# Patient Record
Sex: Female | Born: 2000 | Race: Black or African American | Hispanic: No | Marital: Single | Smoking: Former smoker
Health system: Southern US, Community
[De-identification: ages and names within clinical notes are randomized; demographics above are authoritative.]

## PROBLEM LIST (undated history)

## (undated) ENCOUNTER — Inpatient Hospital Stay (HOSPITAL_COMMUNITY): Payer: Self-pay

## (undated) DIAGNOSIS — R569 Unspecified convulsions: Secondary | ICD-10-CM

## (undated) HISTORY — PX: EYE SURGERY: SHX253

---
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
## 2019-04-14 ENCOUNTER — Ambulatory Visit: Payer: Self-pay | Admitting: Family Medicine

## 2019-05-14 ENCOUNTER — Other Ambulatory Visit: Payer: Self-pay

## 2019-05-14 ENCOUNTER — Encounter (HOSPITAL_COMMUNITY): Payer: Self-pay

## 2019-05-14 ENCOUNTER — Emergency Department (HOSPITAL_COMMUNITY)
Admission: EM | Admit: 2019-05-14 | Discharge: 2019-05-14 | Disposition: A | Discharge status: Home / Self Care | Payer: Self-pay | Attending: Emergency Medicine | Admitting: Emergency Medicine

## 2019-05-14 DIAGNOSIS — L02411 Cutaneous abscess of right axilla: Secondary | ICD-10-CM | POA: Insufficient documentation

## 2019-05-14 DIAGNOSIS — L0291 Cutaneous abscess, unspecified: Secondary | ICD-10-CM

## 2019-05-14 DIAGNOSIS — Z7722 Contact with and (suspected) exposure to environmental tobacco smoke (acute) (chronic): Secondary | ICD-10-CM | POA: Insufficient documentation

## 2019-05-14 LAB — POC URINE PREG, ED: Preg Test, Ur: NEGATIVE

## 2019-05-14 MED ORDER — NAPROXEN 500 MG PO TABS: 500.0000 mg | ORAL_TABLET | Freq: Two times a day (BID) | ORAL | 0 refills | Status: DC

## 2019-05-14 MED ORDER — DOXYCYCLINE HYCLATE 100 MG PO CAPS: 100.0000 mg | ORAL_CAPSULE | Freq: Two times a day (BID) | ORAL | 0 refills | Status: DC

## 2019-05-14 NOTE — ED Provider Notes (Signed)
[facility] DEPT Provider Note   CSN: [id number] Arrival date & time: 05/14/19  1430     History Chief Complaint  Patient presents with  . Recurrent Skin Infections    Carla Bean is a 19 y.o. female with a history of seizures who presents to the ED with complaints of R axillary abscess x 4 days. Patient states she has an area of increasing swelling/pain to the R axilla, worse with palpation, no alleviating factors. Taking ibuprofen without relief. She believes this was triggered by her deoderent use. History of similar which has resolved with abx, has not required prior I&D. Denies fever, chills, nausea, or vomiting.   HPI     Past Medical History:  Diagnosis Date  . Seizures [facility])     Patient Active Problem List   Diagnosis Date Noted  . Screen for STD (sexually transmitted disease) 05/27/2018  . Gonorrhea 12/02/2015  . Chlamydia 12/02/2015  . Vaginal discharge 11/27/2015  . Substance abuse ([place]) 03/25/2012  . Contraception management 11/13/2011  . Dry skin dermatitis 10/27/2010  . Cough 06/03/2010  . SCABIES 12/12/2009  . UNSPECIFIED VISUAL DISTURBANCE 04/09/2009    History reviewed. No pertinent surgical history.   OB History   No obstetric history on file.     History reviewed. No pertinent family history.  Social History   Tobacco Use  . Smoking status: Passive Smoke Exposure - Never Smoker  . Smokeless tobacco: Never Used  . Tobacco comment: mom smokes around her  Substance Use Topics  . Alcohol use: Not on file  . Drug use: Not on file    Home Medications Prior to Admission medications   Not on File    Allergies    Pineapple  Review of Systems   Review of Systems  Constitutional: Negative for chills and fever.  Respiratory: Negative for shortness of breath.   Cardiovascular: Negative for chest pain.  Gastrointestinal: Negative for abdominal pain, nausea and vomiting.  Genitourinary: Negative for  dysuria and vaginal bleeding.  Skin: Positive for wound.    Physical Exam Updated Vital Signs BP 103/66 (BP Location: Right Arm)   Pulse 96   Temp 98 F (36.7 C) (Oral)   Resp 17   LMP 04/30/2019 (LMP Unknown)   SpO2 100%   Physical Exam Vitals and nursing note reviewed.  Constitutional:      General: She is not in acute distress.    Appearance: Normal appearance. She is well-developed. She is not ill-appearing or toxic-appearing.  HENT:     Head: Normocephalic and atraumatic.  Eyes:     General:        Right eye: No discharge.        Left eye: No discharge.     Conjunctiva/sclera: Conjunctivae normal.  Cardiovascular:     Rate and Rhythm: Normal rate and regular rhythm.     Pulses:          Radial pulses are 2+ on the right side and 2+ on the left side.  Pulmonary:     Effort: Pulmonary effort is normal. No respiratory distress.     Breath sounds: Normal breath sounds. No wheezing, rhonchi or rales.  Abdominal:     General: There is no distension.     Palpations: Abdomen is soft.     Tenderness: There is no abdominal tenderness.  Musculoskeletal:     Cervical back: Normal range of motion and neck supple.     Comments: Upper extremities: intact AROM, some pain  to axilla with R shoulder movement. No focal bony tenderness.   Skin:    General: Skin is warm and dry.     Capillary Refill: Capillary refill takes less than 2 seconds.     Findings: No rash.     Comments: R axilla: There is a 2.5 cm diameter area of swelling with mild overlying erythema, somewhat fluctuant to palpation with surrounding induration. No active drainage. No open wounds. Area is tender to palpation.   Neurological:     Mental Status: She is alert.     Comments: Alert. Clear speech. Sensation grossly intact to bilateral upper extremities. 5/5 symmetric grip strength. Ambulatory.   Psychiatric:        Mood and Affect: Mood normal.        Behavior: Behavior normal.     ED Results / Procedures /  Treatments   Labs (all labs ordered are listed, but only abnormal results are displayed) Labs Reviewed  POC URINE PREG, ED    EKG None  Radiology No results found.  Procedures Procedures (including critical care time)  Medications Ordered in ED Medications - No data to display  ED Course  I have reviewed the triage vital signs and the nursing notes.  Pertinent labs & imaging results that were available during my care of the patient were reviewed by me and considered in my medical decision making (see chart for details).    MDM Rules/Calculators/A&P                      Patient presents to the ED with what appears to be a right sided axillary abscess based on physical exam and bedside US. I recommended incision and drainage in the ED, discussed risks/benefits/alternatives, ultimately patient declined procedure, she states she has had similar symptoms which have resolved with antibiotics and would prefer to try this option. We discussed she may ultimately may need to return for I&D, but feel this is reasonable at this time.  Will provide prescription for doxycycline and naproxen, recommended warm compresses. Preg test checked as patient reported chance of pregnancy, negative. I discussed treatment plan, need for follow-up, and return precautions with the patient. Provided opportunity for questions, patient confirmed understanding and is in agreement with plan.   Final Clinical Impression(s) / ED Diagnoses Final diagnoses:  Abscess    Rx / DC Orders ED Discharge Orders         Ordered    doxycycline (VIBRAMYCIN) 100 MG capsule  2 times daily     05/14/19 1533    naproxen (NAPROSYN) 500 MG tablet  2 times daily     05/14/19 1533           Kenlie Seki, [place] [employer] R, PA-C 05/14/19 1537    Charlynne Pander, MD 05/18/19 [phone]

## 2019-05-14 NOTE — Discharge Instructions (Addendum)
You were seen in the emergency department today for an area of pain and swelling to your right underarm area.  We are concerned that there is an process in this location.  We discussed the option of incision and drainage-should you change your mind regarding this procedure you may return to the emergency department at anytime for reevaluation of this possible procedure.   We are sending you home with doxycycline, an antibiotic to treat the infection.  Please take this as prescribed. We are also sending you home with naproxen to help with pain.   - Naproxen is a nonsteroidal anti-inflammatory medication that will help with pain and swelling. Be sure to take this medication as prescribed with food, 1 pill every 12 hours,  It should be taken with food, as it can cause stomach upset, and more seriously, stomach bleeding. Do not take other nonsteroidal anti-inflammatory medications with this such as Advil, Motrin, Aleve, Mobic, Goodie Powder, or Motrin.    You make take Tylenol per over the counter dosing with these medications.   We have prescribed you new medication(s) today. Discuss the medications prescribed today with your pharmacist as they can have adverse effects and interactions with your other medicines including over the counter and prescribed medications. Seek medical evaluation if you start to experience new or abnormal symptoms after taking one of these medicines, seek care immediately if you start to experience difficulty breathing, feeling of your throat closing, facial swelling, or rash as these could be indications of a more serious allergic reaction  Please apply warm compresses to the area for 15 minutes 5 times per day.   Follow up with your primary care provider within 3 days for a recheck of this area. Return to the Er for new or worsening symptoms including but not limited to increased pain, increased swelling, increased redness, fever, chills, or any other concerns.  Your pregnancy  test was negative.

## 2019-05-14 NOTE — ED Triage Notes (Signed)
Pt presents with c/o boil under her right arm. Pt reports hx of same.

## 2019-05-17 ENCOUNTER — Other Ambulatory Visit: Payer: Self-pay

## 2019-05-17 ENCOUNTER — Ambulatory Visit: Payer: Self-pay | Admitting: *Deleted

## 2019-05-17 ENCOUNTER — Emergency Department (HOSPITAL_COMMUNITY)
Admission: EM | Admit: 2019-05-17 | Discharge: 2019-05-17 | Disposition: A | Discharge status: Home / Self Care | Payer: Self-pay | Attending: Emergency Medicine | Admitting: Emergency Medicine

## 2019-05-17 ENCOUNTER — Encounter (HOSPITAL_COMMUNITY): Payer: Self-pay

## 2019-05-17 DIAGNOSIS — Z79899 Other long term (current) drug therapy: Secondary | ICD-10-CM | POA: Insufficient documentation

## 2019-05-17 DIAGNOSIS — F17290 Nicotine dependence, other tobacco product, uncomplicated: Secondary | ICD-10-CM | POA: Insufficient documentation

## 2019-05-17 DIAGNOSIS — L02411 Cutaneous abscess of right axilla: Secondary | ICD-10-CM | POA: Insufficient documentation

## 2019-05-17 MED ORDER — HYDROCODONE-ACETAMINOPHEN 5-325 MG PO TABS
1.0000 | ORAL_TABLET | Freq: Once | ORAL | Status: AC
  Administered 2019-05-17: 1 via ORAL
  Filled 2019-05-17: qty 1

## 2019-05-17 MED ORDER — HYDROCODONE-ACETAMINOPHEN 5-325 MG PO TABS: 1.0000 | ORAL_TABLET | Freq: Four times a day (QID) | ORAL | 0 refills | Status: DC | PRN

## 2019-05-17 MED ORDER — BUPIVACAINE HCL (PF) 0.5 % IJ SOLN
10.0000 mL | Freq: Once | INTRAMUSCULAR | Status: AC
  Administered 2019-05-17: 10 mL
  Filled 2019-05-17: qty 30

## 2019-05-17 MED ORDER — IBUPROFEN 200 MG PO TABS
600.0000 mg | ORAL_TABLET | Freq: Once | ORAL | Status: AC
  Administered 2019-05-17: 15:00:00 600 mg via ORAL
  Filled 2019-05-17: qty 3

## 2019-05-17 MED ORDER — LIDOCAINE-EPINEPHRINE-TETRACAINE (LET) TOPICAL GEL
3.0000 mL | Freq: Once | TOPICAL | Status: AC
  Administered 2019-05-17: 14:00:00 3 mL via TOPICAL
  Filled 2019-05-17: qty 3

## 2019-05-17 NOTE — ED Triage Notes (Signed)
Patient c/o right axillary abscess x 1 week. 

## 2019-05-17 NOTE — ED Provider Notes (Signed)
[place] DEPT Provider Note   CSN: [id number] Arrival date & time: 05/17/19  1037     History Chief Complaint  Patient presents with  . Abscess    Carla Bean is a 19 y.o. female.  HPI      Carla Bean is a 19 y.o. female, with a history of seizures, presenting to the ED with abscess to the right axilla for the last several days. She was seen in the ED a few days ago and I&D was suggested, but patient declined.  A trial of antibiotics was initiated.  Patient voices compliance with these antibiotics. Despite the antibiotics, the area has continued to grow.  Denies fever/chills, nausea/vomiting, numbness, extremity weakness, shortness of breath, or any other complaints.  Past Medical History:  Diagnosis Date  . Seizures [facility])     Patient Active Problem List   Diagnosis Date Noted  . Screen for STD (sexually transmitted disease) 05/27/2018  . Gonorrhea 12/02/2015  . Chlamydia 12/02/2015  . Vaginal discharge 11/27/2015  . Substance abuse ([place]) 03/25/2012  . Contraception management 11/13/2011  . Dry skin dermatitis 10/27/2010  . Cough 06/03/2010  . SCABIES 12/12/2009  . UNSPECIFIED VISUAL DISTURBANCE 04/09/2009    Past Surgical History:  Procedure Laterality Date  . EYE SURGERY       OB History   No obstetric history on file.     Family History  Problem Relation Age of Onset  . Cancer Mother     Social History   Tobacco Use  . Smoking status: Current Every Day Smoker    Types: Cigars  . Smokeless tobacco: Never Used  . Tobacco comment: mom smokes around her  Substance Use Topics  . Alcohol use: Never  . Drug use: Never    Home Medications Prior to Admission medications   Medication Sig Start Date End Date Taking? Authorizing Provider  doxycycline (VIBRAMYCIN) 100 MG capsule Take 1 capsule (100 mg total) by mouth 2 (two) times daily. 05/14/19   Petrucelli, Glynda Jaeger, PA-C  HYDROcodone-acetaminophen  (NORCO/VICODIN) 5-325 MG tablet Take 1-2 tablets by mouth every 6 (six) hours as needed for severe pain. 05/17/19   Makye Radle C, PA-C  naproxen (NAPROSYN) 500 MG tablet Take 1 tablet (500 mg total) by mouth 2 (two) times daily. 05/14/19   Petrucelli, Glynda Jaeger, PA-C    Allergies    Pineapple  Review of Systems   Review of Systems  Constitutional: Negative for chills and fever.  Respiratory: Negative for shortness of breath.   Cardiovascular: Negative for chest pain.  Gastrointestinal: Negative for nausea and vomiting.  Skin:       Right axillary abscess  Neurological: Negative for weakness and numbness.    Physical Exam Updated Vital Signs BP 117/72 (BP Location: Right Arm)   Pulse 88   Temp 98.6 F (37 C) (Oral)   Resp 18   Ht 5\' 3"  (1.6 m)   Wt 54 kg   LMP 04/30/2019 (LMP Unknown)   SpO2 100%   BMI 21.08 kg/m   Physical Exam Vitals and nursing note reviewed.  Constitutional:      General: She is not in acute distress.    Appearance: She is well-developed. She is not diaphoretic.  HENT:     Head: Normocephalic and atraumatic.  Eyes:     Conjunctiva/sclera: Conjunctivae normal.  Cardiovascular:     Rate and Rhythm: Normal rate and regular rhythm.     Pulses:  Radial pulses are 2+ on the right side.  Pulmonary:     Effort: Pulmonary effort is normal.  Musculoskeletal:     Cervical back: Neck supple.     Comments: Right axillary abscess approximately 4 x 2 cm.  Quite tender.  No surrounding tenderness, erythema, or increased warmth.  Skin:    General: Skin is warm and dry.     Coloration: Skin is not pale.  Neurological:     Mental Status: She is alert.     Comments: Sensation light touch grossly intact in the right upper extremity. Grip strength equal bilaterally. Strength 5/5 in the upper extremities.  Psychiatric:        Behavior: Behavior normal.     ED Results / Procedures / Treatments   Labs (all labs ordered are listed, but only abnormal  results are displayed) Labs Reviewed - No data to display  EKG None  Radiology No results found.  Procedures .Marland KitchenIncision and Drainage  Date/Time: 05/17/2019 2:45 PM Performed by: Anselm Pancoast, PA-C Authorized by: Anselm Pancoast, PA-C   Consent:    Consent obtained:  Verbal   Consent given by:  Patient   Risks discussed:  Bleeding, pain, incomplete drainage, infection and damage to other organs Location:    Type:  Abscess   Size:  4 cm   Location:  Upper extremity   Upper extremity location: Right axilla. Pre-procedure details:    Skin preparation:  Betadine Anesthesia (see MAR for exact dosages):    Anesthesia method:  Topical application and local infiltration   Topical anesthetic:  LET   Local anesthetic:  Bupivacaine 0.5% w/o epi Procedure type:    Complexity:  Complex Procedure details:    Incision types:  Single straight   Incision depth:  Subcutaneous   Scalpel blade:  11   Wound management:  Probed and deloculated, irrigated with saline and extensive cleaning   Drainage:  Purulent and bloody   Drainage amount:  Copious   Wound treatment:  Wound left open Post-procedure details:    Patient tolerance of procedure:  Tolerated well, no immediate complications  Ultrasound ED Soft Tissue  Date/Time: 05/17/2019 2:09 PM Performed by: Anselm Pancoast, PA-C Authorized by: Anselm Pancoast, PA-C   Procedure details:    Indications: localization of abscess     Transverse view:  Visualized   Longitudinal view:  Visualized   Images: archived     Limitations:  Patient compliance Location:    Location: axilla     Side:  Right Findings:     abscess present   (including critical care time)  Medications Ordered in ED Medications  bupivacaine (MARCAINE) 0.5 % injection 10 mL (10 mLs Infiltration Given 05/17/19 1513)  lidocaine-EPINEPHrine-tetracaine (LET) topical gel (3 mLs Topical Given 05/17/19 1346)  HYDROcodone-acetaminophen (NORCO/VICODIN) 5-325 MG per tablet 1 tablet (1  tablet Oral Given 05/17/19 1512)  ibuprofen (ADVIL) tablet 600 mg (600 mg Oral Given 05/17/19 1512)    ED Course  I have reviewed the triage vital signs and the nursing notes.  Pertinent labs & imaging results that were available during my care of the patient were reviewed by me and considered in my medical decision making (see chart for details).    MDM Rules/Calculators/A&P                      Patient presents with an abscess to the right axilla. Neurovascularly intact in the right upper extremity before and after I&D. Patient  advised to continue antibiotic therapy. Due to the size of the abscess, patient was advised to follow-up with general surgery should abscess start to reform. The patient was given instructions for home care as well as return precautions. Patient voices understanding of these instructions, accepts the plan, and is comfortable with discharge.    Final Clinical Impression(s) / ED Diagnoses Final diagnoses:  Abscess of axilla, right    Rx / DC Orders ED Discharge Orders         Ordered    HYDROcodone-acetaminophen (NORCO/VICODIN) 5-325 MG tablet  Every 6 hours PRN     05/17/19 1458           Anselm Pancoast, PA-C 05/17/19 1517    Derwood Kaplan, MD 05/18/19 [id number]

## 2019-05-17 NOTE — Discharge Instructions (Signed)
Wound Care - After I&D of Abscess  You may remove the bandage after 24 hours.  The only reason to replace the bandage is to protect clothing from drainage. Bandages, if used, should be replaced daily or whenever soiled. The wound may continue to drain for the next 2-3 days.   Cleaning: Clean the wound and surrounding area gently with tap water and mild soap. Rinse well and blot dry. You may shower normally. Soaking the wound in Epsom salt baths for no more than 15 minutes once a day may help rinse out any remaining pus and help with wound healing.  Clean the wound daily to prevent further infection. Do not use cleaners such as hydrogen peroxide or alcohol.   Scar reduction: Application of a topical antibiotic ointment, such as Neosporin, after the wound has begun to close and heal well can decrease scab formation and reduce scarring. After the wound has healed, application of ointments such as Aquaphor can also reduce scar formation.  The key to scar reduction is keeping the skin well hydrated and supple. Drinking plenty of water throughout the day (At least eight 8oz glasses of water a day) is essential to staying well hydrated.  Sun exposure: Keep the wound out of the sun. After the wound has healed, continue to protect it from the sun by wearing protective clothing or applying sunscreen.  Pain: You may use Tylenol, naproxen, or ibuprofen for pain. Antiinflammatory medications: Take 600 mg of ibuprofen every 6 hours or 440 mg (over the counter dose) to 500 mg (prescription dose) of naproxen every 12 hours for the next 3 days. After this time, these medications may be used as needed for pain. Take these medications with food to avoid upset stomach. Choose only one of these medications, do not take them together. Acetaminophen (generic for Tylenol): Should you continue to have additional pain while taking the ibuprofen or naproxen, you may add in acetaminophen as needed. Your daily total maximum amount  of acetaminophen from all sources should be limited to 4000mg/day for persons without liver problems, or 2000mg/day for those with liver problems.  Prevention: There are some people who have a predisposition to abscess formation, however, there are some things that can be done to prevent abscesses in many people.  Most abscesses form because bacteria that naturally lives on the skin gets trapped underneath the skin.  This can occur through openings too small to see. Before and after any area of skin is shaved, wax, or abraded in any manner, the area should be washed with soap and water and rinsed well.   If you are having trouble with recurrent abscesses, it may be wise to perform a chlorhexidine wash regimen.  For 1 week, wash all of your body with chlorhexidine (available over-the-counter at most pharmacies). You may also need to reevaluate your use of daily soap as soaps with perfumes or dyes can increase the chances of infection in some people.  Follow up: Please return to the ED or go to your primary care provider in 2-3 days for a wound check to assure proper healing.  Return: Return to the ED sooner should signs of worsening infection arise, such as spreading redness, worsening puffiness/swelling, severe increase in pain, fever over 100.3F, or any other major issues.  For prescription assistance, may try using prescription discount sites or apps, such as goodrx.com 

## 2019-05-17 NOTE — Telephone Encounter (Signed)
Patient states she was at hospital today and after she left within 2 hours started seeing bumps of face  Reason for Disposition . [1] Widespread hives AND [2] onset < 2 hours of exposure to 1st dose of drug    Rash on face started 2 hours after antibiotic- has spread on L side of face- started with itching- not itching now- no color at this time. 5 bumps present- face and under chin. Advised ED/UC for evaluation of possible medication reaction.  Answer Assessment - Initial Assessment Questions 1. APPEARANCE of RASH: "Describe the rash."      Acne type bumps 2. LOCATION: "Where is the rash located?"      L side of face 3. NUMBER: "How many spots are there?"      5  4. SIZE: "How big are the spots?" (Inches, centimeters or compare to size of a coin)      Mosquito bite, side of face- pencil eraser size 5. ONSET: "When did the rash start?"      1 hour ago 6. ITCHING: "Does the rash itch?" If so, ask: "How bad is the itch?"  (Scale 1-10; or mild, moderate, severe)     Started with itching- not itching now 7. PAIN: "Does the rash hurt?" If so, ask: "How bad is the pain?"  (Scale 1-10; or mild, moderate, severe)     No pain 8. OTHER SYMPTOMS: "Do you have any other symptoms?" (e.g., fever)     no 9. PREGNANCY: "Is there any chance you are pregnant?" "When was your last menstrual period?"     No- LMP- 3/28  Answer Assessment - Initial Assessment Questions 1.   NAME of MEDICATION: "What medicine are you calling about?"     Antibiotic given at ED 2.   QUESTION: "What is your question?"     Bumps appearing on L side of face within 2 hours of medication 3.   PRESCRIBING HCP: "Who prescribed it?" Reason: if prescribed by specialist, call should be referred to that group.     ED provider 4. SYMPTOMS: "Do you have any symptoms?"     Itching and bumps on face 5. SEVERITY: If symptoms are present, ask "Are they mild, moderate or severe?"     mild 6.  PREGNANCY:  "Is there any chance that you are  pregnant?" "When was your last menstrual period?"     n/a  Protocols used: RASH - WIDESPREAD ON DRUGS-A-AH, RASH OR REDNESS - LOCALIZED-A-AH, MEDICATION QUESTION CALL-A-AH

## 2019-05-31 ENCOUNTER — Ambulatory Visit (INDEPENDENT_AMBULATORY_CARE_PROVIDER_SITE_OTHER): Payer: Self-pay | Admitting: Family Medicine

## 2019-05-31 ENCOUNTER — Other Ambulatory Visit: Payer: Self-pay

## 2019-05-31 VITALS — BP 104/60 | HR 68 | Wt 116.0 lb

## 2019-05-31 DIAGNOSIS — Z3200 Encounter for pregnancy test, result unknown: Secondary | ICD-10-CM

## 2019-05-31 DIAGNOSIS — Z3202 Encounter for pregnancy test, result negative: Secondary | ICD-10-CM

## 2019-05-31 LAB — POCT URINE PREGNANCY: Preg Test, Ur: NEGATIVE

## 2019-05-31 NOTE — Progress Notes (Signed)
   SUBJECTIVE:   CHIEF COMPLAINT / HPI:   Irregular bleeding: Patient reporting today for pregnancy test after "missed period" on 05/24/2019. Last period was 04/30/2019. Patient reports that she bleeding regularly each month. She reports initially that she does not desire to be pregnant and then states she does want to be pregnant. Patient states she is worried she cannot conceive after receiving depo while in prison and her last injection was >3 months ago. She is currently not using any contraception. She does not wish to be tested for STI's. She denies any other concerns other than wanting a pregnancy test here int he office. Patient reports she has been trying to pregnant for over a year now but is not consistent and has not tracked any of her periods. She also reports not taking any prenatal vitamins.   PERTINENT  PMH / PSH: n/a  OBJECTIVE:  BP 104/60   Pulse 68   Wt 116 lb (52.6 kg)   LMP 04/30/2019 (LMP Unknown)   BMI 20.55 kg/m   General: NAD, pleasant Respiratory:  normal work of breathing Psych: flat affect  ASSESSMENT/PLAN:   Encounter for pregnancy test LMP 3/28. Negative test here today. Patient initially stated she did not want to be pregnant and was happy with negative results. Counseled on different contraception options, then patient stated she did want to be pregnant and had been trying unsuccessfully for quite some time. Asked patient to discuss with partner (who was at visit) what they both want and encouraged her to track her periods and take ovulation tests as well as prenatal vitamins and healthy behaviors if she is going to try to become pregant.     [place] Everett Ehrler, DO PGY-3, [facility]

## 2019-05-31 NOTE — Patient Instructions (Signed)
Thank you for coming to see me today. It was a pleasure! Today we talked about:   You can go to bedsider.org in order to find out more information on different tools for contraception if you decide you would not like to be pregnant at this time.  There are a lot of good options out there.  If you think of any questions or have any concerns and do not hesitate to call our office or come back in for a visit.  If you are thinking about becoming pregnant then it is important that you start taking a prenatal vitamin every day in order to ensure that you are getting the correct amount of vitamins early in pregnancy.  Please follow-up as needed.  If you have any questions or concerns, please do not hesitate to call the office at [phone].  Take Care,   [place] Bates Collington, DO

## 2019-06-03 DIAGNOSIS — Z3200 Encounter for pregnancy test, result unknown: Secondary | ICD-10-CM | POA: Insufficient documentation

## 2019-06-03 NOTE — Assessment & Plan Note (Signed)
LMP 3/28. Negative test here today. Patient initially stated she did not want to be pregnant and was happy with negative results. Counseled on different contraception options, then patient stated she did want to be pregnant and had been trying unsuccessfully for quite some time. Asked patient to discuss with partner (who was at visit) what they both want and encouraged her to track her periods and take ovulation tests as well as prenatal vitamins and healthy behaviors if she is going to try to become pregant.

## 2019-08-31 ENCOUNTER — Encounter (HOSPITAL_COMMUNITY): Payer: Self-pay | Admitting: Emergency Medicine

## 2019-08-31 ENCOUNTER — Other Ambulatory Visit: Payer: Self-pay

## 2019-08-31 ENCOUNTER — Emergency Department (HOSPITAL_COMMUNITY): Payer: HRSA Program

## 2019-08-31 ENCOUNTER — Emergency Department (HOSPITAL_COMMUNITY)
Admission: EM | Admit: 2019-08-31 | Discharge: 2019-08-31 | Disposition: A | Discharge status: Home / Self Care | Payer: HRSA Program | Attending: Emergency Medicine | Admitting: Emergency Medicine

## 2019-08-31 DIAGNOSIS — B349 Viral infection, unspecified: Secondary | ICD-10-CM | POA: Diagnosis not present

## 2019-08-31 DIAGNOSIS — R509 Fever, unspecified: Secondary | ICD-10-CM | POA: Diagnosis present

## 2019-08-31 DIAGNOSIS — F17290 Nicotine dependence, other tobacco product, uncomplicated: Secondary | ICD-10-CM | POA: Diagnosis not present

## 2019-08-31 DIAGNOSIS — U071 COVID-19: Secondary | ICD-10-CM | POA: Diagnosis not present

## 2019-08-31 DIAGNOSIS — M791 Myalgia, unspecified site: Secondary | ICD-10-CM | POA: Insufficient documentation

## 2019-08-31 DIAGNOSIS — Z20822 Contact with and (suspected) exposure to covid-19: Secondary | ICD-10-CM

## 2019-08-31 LAB — CBC WITH DIFFERENTIAL/PLATELET
Abs Immature Granulocytes: 0.02 10*3/uL (ref 0.00–0.07)
Basophils Absolute: 0 10*3/uL (ref 0.0–0.1)
Basophils Relative: 1 %
Eosinophils Absolute: 0.2 10*3/uL (ref 0.0–0.5)
Eosinophils Relative: 3 %
HCT: 39.9 % (ref 36.0–46.0)
Hemoglobin: 12.5 g/dL (ref 12.0–15.0)
Immature Granulocytes: 0 %
Lymphocytes Relative: 12 %
Lymphs Abs: 0.7 10*3/uL (ref 0.7–4.0)
MCH: 29.2 pg (ref 26.0–34.0)
MCHC: 31.3 g/dL (ref 30.0–36.0)
MCV: 93.2 fL (ref 80.0–100.0)
Monocytes Absolute: 0.5 10*3/uL (ref 0.1–1.0)
Monocytes Relative: 9 %
Neutro Abs: 4.3 10*3/uL (ref 1.7–7.7)
Neutrophils Relative %: 75 %
Platelets: 158 10*3/uL (ref 150–400)
RBC: 4.28 MIL/uL (ref 3.87–5.11)
RDW: 12.5 % (ref 11.5–15.5)
WBC: 5.7 10*3/uL (ref 4.0–10.5)
nRBC: 0 % (ref 0.0–0.2)

## 2019-08-31 LAB — URINALYSIS, ROUTINE W REFLEX MICROSCOPIC
Bilirubin Urine: NEGATIVE
Glucose, UA: NEGATIVE mg/dL
Hgb urine dipstick: NEGATIVE
Ketones, ur: NEGATIVE mg/dL
Leukocytes,Ua: NEGATIVE
Nitrite: NEGATIVE
Protein, ur: NEGATIVE mg/dL
Specific Gravity, Urine: 1.018 (ref 1.005–1.030)
pH: 8 (ref 5.0–8.0)

## 2019-08-31 LAB — COMPREHENSIVE METABOLIC PANEL
ALT: 10 U/L (ref 0–44)
AST: 16 U/L (ref 15–41)
Albumin: 4.1 g/dL (ref 3.5–5.0)
Alkaline Phosphatase: 60 U/L (ref 38–126)
Anion gap: 9 (ref 5–15)
BUN: <5 mg/dL — ABNORMAL LOW (ref 6–20)
CO2: 22 mmol/L (ref 22–32)
Calcium: 9.2 mg/dL (ref 8.9–10.3)
Chloride: 102 mmol/L (ref 98–111)
Creatinine, Ser: 0.85 mg/dL (ref 0.44–1.00)
GFR calc Af Amer: >60 mL/min (ref >60)
GFR calc non Af Amer: >60 mL/min (ref >60)
Glucose, Bld: 102 mg/dL — ABNORMAL HIGH (ref 70–99)
Potassium: 3.8 mmol/L (ref 3.5–5.1)
Sodium: 133 mmol/L — ABNORMAL LOW (ref 135–145)
Total Bilirubin: 0.5 mg/dL (ref 0.3–1.2)
Total Protein: 7.9 g/dL (ref 6.5–8.1)

## 2019-08-31 LAB — I-STAT BETA HCG BLOOD, ED (MC, WL, AP ONLY): I-stat hCG, quantitative: <5 m[IU]/mL (ref <5)

## 2019-08-31 LAB — SARS CORONAVIRUS 2 BY RT PCR (DIASORIN): SARS Coronavirus 2: POSITIVE — AB

## 2019-08-31 MED ORDER — ACETAMINOPHEN 325 MG PO TABS
650.0000 mg | ORAL_TABLET | Freq: Once | ORAL | Status: AC
  Administered 2019-08-31: 650 mg via ORAL
  Filled 2019-08-31: qty 2

## 2019-08-31 NOTE — ED Provider Notes (Signed)
[facility] EMERGENCY DEPARTMENT Provider Note   CSN: [id number] Arrival date & time: 08/31/19  2001     History Chief Complaint  Patient presents with  . Fever    Toryn Dewalt is a 19 y.o. female hx of STD here presenting with chills and fever and body aches and cough.  Patient states that she has some fevers and chills since yesterday.  She also has some dry cough as well.  She also has diffuse myalgias as well.  Denies any urinary symptoms.  Denies any neck pain or stiffness.  Patient states that she did not get Covid vaccine previously but denies any known contacts with Covid.  The history is provided by the patient.       Past Medical History:  Diagnosis Date  . Seizures [facility])     Patient Active Problem List   Diagnosis Date Noted  . Encounter for pregnancy test 06/03/2019  . Screen for STD (sexually transmitted disease) 05/27/2018  . Gonorrhea 12/02/2015  . Chlamydia 12/02/2015  . Vaginal discharge 11/27/2015  . Substance abuse (HCC) 03/25/2012  . Contraception management 11/13/2011    Past Surgical History:  Procedure Laterality Date  . EYE SURGERY       OB History   No obstetric history on file.     Family History  Problem Relation Age of Onset  . Cancer Mother     Social History   Tobacco Use  . Smoking status: Current Every Day Smoker    Types: Cigars  . Smokeless tobacco: Never Used  . Tobacco comment: mom smokes around her  Vaping Use  . Vaping Use: Never used  Substance Use Topics  . Alcohol use: Never  . Drug use: Never    Home Medications Prior to Admission medications   Medication Sig Start Date End Date Taking? Authorizing Provider  doxycycline (VIBRAMYCIN) 100 MG capsule Take 1 capsule (100 mg total) by mouth 2 (two) times daily. 05/14/19   Petrucelli, Pleas Koch, PA-C  HYDROcodone-acetaminophen (NORCO/VICODIN) 5-325 MG tablet Take 1-2 tablets by mouth every 6 (six) hours as needed for severe pain. 05/17/19    Joy, Shawn C, PA-C  naproxen (NAPROSYN) 500 MG tablet Take 1 tablet (500 mg total) by mouth 2 (two) times daily. 05/14/19   Petrucelli, Pleas Koch, PA-C    Allergies    Pineapple  Review of Systems   Review of Systems  Constitutional: Positive for fever.  Respiratory: Positive for cough.   All other systems reviewed and are negative.   Physical Exam Updated Vital Signs BP 120/68 (BP Location: Right Arm)   Pulse 65   Temp 98.3 F (36.8 C) (Oral)   Resp 20   Ht 5\' 3"  (1.6 m)   Wt 55 kg   LMP 08/09/2019 (Approximate)   SpO2 97%   BMI 21.48 kg/m   Physical Exam Vitals and nursing note reviewed.  Constitutional:      Comments: Slightly uncomfortable but well appearing   HENT:     Head: Normocephalic.     Nose: Nose normal.     Mouth/Throat:     Mouth: Mucous membranes are moist.  Eyes:     Extraocular Movements: Extraocular movements intact.     Pupils: Pupils are equal, round, and reactive to light.  Cardiovascular:     Rate and Rhythm: Normal rate and regular rhythm.     Pulses: Normal pulses.     Heart sounds: Normal heart sounds.  Pulmonary:     Effort:  Pulmonary effort is normal.     Breath sounds: Normal breath sounds.  Abdominal:     General: Abdomen is flat.     Palpations: Abdomen is soft.  Musculoskeletal:        General: Normal range of motion.     Cervical back: Normal range of motion and neck supple.  Skin:    General: Skin is warm.     Capillary Refill: Capillary refill takes less than 2 seconds.  Neurological:     General: No focal deficit present.     Mental Status: She is oriented to person, place, and time.  Psychiatric:        Mood and Affect: Mood normal.        Behavior: Behavior normal.     ED Results / Procedures / Treatments   Labs (all labs ordered are listed, but only abnormal results are displayed) Labs Reviewed  COMPREHENSIVE METABOLIC PANEL - Abnormal; Notable for the following components:      Result Value   Sodium 133 (*)     Glucose, Bld 102 (*)    BUN <5 (*)    All other components within normal limits  URINALYSIS, ROUTINE W REFLEX MICROSCOPIC - Abnormal; Notable for the following components:   APPearance HAZY (*)    All other components within normal limits  SARS CORONAVIRUS 2 BY RT PCR (DIASORIN)  CBC WITH DIFFERENTIAL/PLATELET  I-STAT BETA HCG BLOOD, ED ([facility], [facility], [facility] ONLY)    EKG None  Radiology DG Chest 2 View  Result Date: 08/31/2019 CLINICAL DATA:  Cough and fever EXAM: CHEST - 2 VIEW COMPARISON:  06/11/2014 FINDINGS: The heart size and mediastinal contours are within normal limits. Both lungs are clear. The visualized skeletal structures are unremarkable. IMPRESSION: No active cardiopulmonary disease. Electronically Signed   By: Jasmine Pang M.D.   On: 08/31/2019 21:08    Procedures Procedures (including critical care time)  Medications Ordered in ED Medications  acetaminophen (TYLENOL) tablet 650 mg (650 mg Oral Given 08/31/19 2026)    ED Course  I have reviewed the triage vital signs and the nursing notes.  Pertinent labs & imaging results that were available during my care of the patient were reviewed by me and considered in my medical decision making (see chart for details).    MDM Rules/Calculators/A&P                          Dejae Bernet is a 19 y.o. female here with cough, fever, chills, myalgias. I think likely COVID vs viral syndrome. Will get cbc, cmp, UA, COVID, CXR.   10:29 PM Temp down to 98 from 103. No meningeal signs. WBC normal. UA nl. CXR clear. COVId pending. Told her to stay home until COVID test comes back. If she is COVID positive, she needs to quarantine for 10 days.   Corleen Otwell was evaluated in Emergency Department on 08/31/2019 for the symptoms described in the history of present illness. She was evaluated in the context of the global COVID-19 pandemic, which necessitated consideration that the patient might be at risk for infection with the  SARS-CoV-2 virus that causes COVID-19. Institutional protocols and algorithms that pertain to the evaluation of patients at risk for COVID-19 are in a state of rapid change based on information released by regulatory bodies including the CDC and federal and state organizations. These policies and algorithms were followed during the patient's care in the ED.   Final Clinical Impression(s) /  ED Diagnoses Final diagnoses:  Viral syndrome  COVID-19 virus test result unknown    Rx / DC Orders ED Discharge Orders    None       Charlynne Pander, MD 08/31/19 2230

## 2019-08-31 NOTE — Discharge Instructions (Signed)
You were tested for Covid today.  Please stay home until your results come back.  If you do have Covid, you need to stay home for 10 days.  Take Tylenol and Motrin for fever.  See your doctor for follow-up.  Stay hydrated.  Return to ER if you have persistent fever for a week, trouble breathing, abdominal pain, vomiting.     Person Under Monitoring Name: Carla Bean  Location: [address] [place] [address]   Infection Prevention Recommendations for Individuals Confirmed to have, or Being Evaluated for, 2019 Novel Coronavirus (COVID-19) Infection Who Receive Care at Home  Individuals who are confirmed to have, or are being evaluated for, COVID-19 should follow the prevention steps below until a healthcare provider or local or state health department says they can return to normal activities.  Stay home except to get medical care You should restrict activities outside your home, except for getting medical care. Do not go to work, school, or public areas, and do not use public transportation or taxis.  Call ahead before visiting your doctor Before your medical appointment, call the healthcare provider and tell them that you have, or are being evaluated for, COVID-19 infection. This will help the healthcare provider's office take steps to keep other people from getting infected. Ask your healthcare provider to call the local or state health department.  Monitor your symptoms Seek prompt medical attention if your illness is worsening (e.g., difficulty breathing). Before going to your medical appointment, call the healthcare provider and tell them that you have, or are being evaluated for, COVID-19 infection. Ask your healthcare provider to call the local or state health department.  Wear a facemask You should wear a facemask that covers your nose and mouth when you are in the same room with other people and when you visit a healthcare provider. People who  live with or visit you should also wear a facemask while they are in the same room with you.  Separate yourself from other people in your home As much as possible, you should stay in a different room from other people in your home. Also, you should use a separate bathroom, if available.  Avoid sharing household items You should not share dishes, drinking glasses, cups, eating utensils, towels, bedding, or other items with other people in your home. After using these items, you should wash them thoroughly with soap and water.  Cover your coughs and sneezes Cover your mouth and nose with a tissue when you cough or sneeze, or you can cough or sneeze into your sleeve. Throw used tissues in a lined trash can, and immediately wash your hands with soap and water for at least 20 seconds or use an alcohol-based hand rub.  Wash your [employer] your hands often and thoroughly with soap and water for at least 20 seconds. You can use an alcohol-based hand sanitizer if soap and water are not available and if your hands are not visibly dirty. Avoid touching your eyes, nose, and mouth with unwashed hands.   Prevention Steps for Caregivers and Household Members of Individuals Confirmed to have, or Being Evaluated for, COVID-19 Infection Being Cared for in the Home  If you live with, or provide care at home for, a person confirmed to have, or being evaluated for, COVID-19 infection please follow these guidelines to prevent infection:  Follow healthcare provider's instructions Make sure that you understand and can help the patient follow any healthcare provider instructions for all care.  Provide for the patient's basic needs You should help the patient with basic needs in the home and provide support for getting groceries, prescriptions, and other personal needs.  Monitor the patient's symptoms If they are getting sicker, call his or her medical provider and tell them that the patient has, or is  being evaluated for, COVID-19 infection. This will help the healthcare provider's office take steps to keep other people from getting infected. Ask the healthcare provider to call the local or state health department.  Limit the number of people who have contact with the patient If possible, have only one caregiver for the patient. Other household members should stay in another home or place of residence. If this is not possible, they should stay in another room, or be separated from the patient as much as possible. Use a separate bathroom, if available. Restrict visitors who do not have an essential need to be in the home.  Keep older adults, very young children, and other sick people away from the patient Keep older adults, very young children, and those who have compromised immune systems or chronic health conditions away from the patient. This includes people with chronic heart, lung, or kidney conditions, diabetes, and cancer.  Ensure good ventilation Make sure that shared spaces in the home have good air flow, such as from an air conditioner or an opened window, weather permitting.  Wash your hands often Wash your hands often and thoroughly with soap and water for at least 20 seconds. You can use an alcohol based hand sanitizer if soap and water are not available and if your hands are not visibly dirty. Avoid touching your eyes, nose, and mouth with unwashed hands. Use disposable paper towels to dry your hands. If not available, use dedicated cloth towels and replace them when they become wet.  Wear a facemask and gloves Wear a disposable facemask at all times in the room and gloves when you touch or have contact with the patient's blood, body fluids, and/or secretions or excretions, such as sweat, saliva, sputum, nasal mucus, vomit, urine, or feces.  Ensure the mask fits over your nose and mouth tightly, and do not touch it during use. Throw out disposable facemasks and gloves after  using them. Do not reuse. Wash your hands immediately after removing your facemask and gloves. If your personal clothing becomes contaminated, carefully remove clothing and launder. Wash your hands after handling contaminated clothing. Place all used disposable facemasks, gloves, and other waste in a lined container before disposing them with other household waste. Remove gloves and wash your hands immediately after handling these items.  Do not share dishes, glasses, or other household items with the patient Avoid sharing household items. You should not share dishes, drinking glasses, cups, eating utensils, towels, bedding, or other items with a patient who is confirmed to have, or being evaluated for, COVID-19 infection. After the person uses these items, you should wash them thoroughly with soap and water.  Wash laundry thoroughly Immediately remove and wash clothes or bedding that have blood, body fluids, and/or secretions or excretions, such as sweat, saliva, sputum, nasal mucus, vomit, urine, or feces, on them. Wear gloves when handling laundry from the patient. Read and follow directions on labels of laundry or clothing items and detergent. In general, wash and dry with the warmest temperatures recommended on the label.  Clean all areas the individual has used often Clean all touchable surfaces, such as counters, tabletops, doorknobs, bathroom fixtures, toilets, phones, keyboards, tablets,  and bedside tables, every day. Also, clean any surfaces that may have blood, body fluids, and/or secretions or excretions on them. Wear gloves when cleaning surfaces the patient has come in contact with. Use a diluted bleach solution (e.g., dilute bleach with 1 part bleach and 10 parts water) or a household disinfectant with a label that says EPA-registered for coronaviruses. To make a bleach solution at home, add 1 tablespoon of bleach to 1 quart (4 cups) of water. For a larger supply, add  cup of bleach  to 1 gallon (16 cups) of water. Read labels of cleaning products and follow recommendations provided on product labels. Labels contain instructions for safe and effective use of the cleaning product including precautions you should take when applying the product, such as wearing gloves or eye protection and making sure you have good ventilation during use of the product. Remove gloves and wash hands immediately after cleaning.  Monitor yourself for signs and symptoms of illness Caregivers and household members are considered close contacts, should monitor their health, and will be asked to limit movement outside of the home to the extent possible. Follow the monitoring steps for close contacts listed on the symptom monitoring form.   ? If you have additional questions, contact your local health department or call the epidemiologist on call at [phone] (available 24/7). ? This guidance is subject to change. For the most up-to-date guidance from [facility], please refer to their website: [email or web address][email or web address]

## 2019-08-31 NOTE — ED Triage Notes (Signed)
Patient reports fever with chills , mild headache and occasional dry cough onset yesterday , no dysuria , respirations unlabored .

## 2019-12-29 ENCOUNTER — Encounter: Payer: Self-pay | Admitting: Surgery

## 2019-12-29 ENCOUNTER — Emergency Department (HOSPITAL_COMMUNITY): Payer: Self-pay

## 2019-12-29 ENCOUNTER — Encounter (HOSPITAL_COMMUNITY): Payer: Self-pay

## 2019-12-29 ENCOUNTER — Emergency Department (HOSPITAL_COMMUNITY)
Admission: EM | Admit: 2019-12-29 | Discharge: 2019-12-29 | Disposition: A | Discharge status: Home / Self Care | Payer: Self-pay | Attending: Emergency Medicine | Admitting: Emergency Medicine

## 2019-12-29 ENCOUNTER — Other Ambulatory Visit: Payer: Self-pay

## 2019-12-29 DIAGNOSIS — B9689 Other specified bacterial agents as the cause of diseases classified elsewhere: Secondary | ICD-10-CM

## 2019-12-29 DIAGNOSIS — R1030 Lower abdominal pain, unspecified: Secondary | ICD-10-CM | POA: Insufficient documentation

## 2019-12-29 DIAGNOSIS — N760 Acute vaginitis: Secondary | ICD-10-CM | POA: Insufficient documentation

## 2019-12-29 DIAGNOSIS — F17290 Nicotine dependence, other tobacco product, uncomplicated: Secondary | ICD-10-CM | POA: Insufficient documentation

## 2019-12-29 DIAGNOSIS — K529 Noninfective gastroenteritis and colitis, unspecified: Secondary | ICD-10-CM | POA: Insufficient documentation

## 2019-12-29 LAB — URINALYSIS, ROUTINE W REFLEX MICROSCOPIC
Bacteria, UA: NONE SEEN
Bilirubin Urine: NEGATIVE
Glucose, UA: NEGATIVE mg/dL
Ketones, ur: NEGATIVE mg/dL
Leukocytes,Ua: NEGATIVE
Nitrite: NEGATIVE
Protein, ur: NEGATIVE mg/dL
Specific Gravity, Urine: 1.014 (ref 1.005–1.030)
pH: 7 (ref 5.0–8.0)

## 2019-12-29 LAB — I-STAT BETA HCG BLOOD, ED (MC, WL, AP ONLY): I-stat hCG, quantitative: <5 m[IU]/mL (ref <5)

## 2019-12-29 LAB — WET PREP, GENITAL
Sperm: NONE SEEN
Trich, Wet Prep: NONE SEEN
Yeast Wet Prep HPF POC: NONE SEEN

## 2019-12-29 LAB — LIPASE, BLOOD: Lipase: 35 U/L (ref 11–51)

## 2019-12-29 LAB — COMPREHENSIVE METABOLIC PANEL
ALT: 9 U/L (ref 0–44)
AST: 14 U/L — ABNORMAL LOW (ref 15–41)
Albumin: 4.6 g/dL (ref 3.5–5.0)
Alkaline Phosphatase: 58 U/L (ref 38–126)
Anion gap: 11 (ref 5–15)
BUN: 6 mg/dL (ref 6–20)
CO2: 22 mmol/L (ref 22–32)
Calcium: 9.1 mg/dL (ref 8.9–10.3)
Chloride: 101 mmol/L (ref 98–111)
Creatinine, Ser: 0.63 mg/dL (ref 0.44–1.00)
GFR, Estimated: >60 mL/min (ref >60)
Glucose, Bld: 111 mg/dL — ABNORMAL HIGH (ref 70–99)
Potassium: 3.5 mmol/L (ref 3.5–5.1)
Sodium: 134 mmol/L — ABNORMAL LOW (ref 135–145)
Total Bilirubin: 0.8 mg/dL (ref 0.3–1.2)
Total Protein: 8.2 g/dL — ABNORMAL HIGH (ref 6.5–8.1)

## 2019-12-29 LAB — CBC
HCT: 39.6 % (ref 36.0–46.0)
Hemoglobin: 12.8 g/dL (ref 12.0–15.0)
MCH: 30.7 pg (ref 26.0–34.0)
MCHC: 32.3 g/dL (ref 30.0–36.0)
MCV: 95 fL (ref 80.0–100.0)
Platelets: 174 10*3/uL (ref 150–400)
RBC: 4.17 MIL/uL (ref 3.87–5.11)
RDW: 12 % (ref 11.5–15.5)
WBC: 15.2 10*3/uL — ABNORMAL HIGH (ref 4.0–10.5)
nRBC: 0 % (ref 0.0–0.2)

## 2019-12-29 MED ORDER — IOHEXOL 300 MG/ML  SOLN
100.0000 mL | Freq: Once | INTRAMUSCULAR | Status: AC | PRN
  Administered 2019-12-29: 100 mL via INTRAVENOUS

## 2019-12-29 MED ORDER — METRONIDAZOLE 500 MG PO TABS: 500.0000 mg | ORAL_TABLET | Freq: Two times a day (BID) | ORAL | 0 refills | Status: AC

## 2019-12-29 NOTE — ED Provider Notes (Signed)
[facility] EMERGENCY DEPARTMENT Provider Note  CSN: [id number] Arrival date & time: 12/29/19 1321    History Chief Complaint  Patient presents with  . possible STD  . Abdominal Pain    HPI  Carla Bean is a 19 y.o. female with no significant PMH who reports onset of moderate to severe cramping lower abdominal pain yesterday, not similar to her previous menstrual cramps. LMP started 2 days ago. She reports pain is worse when urinating but no dysuria or hematuria. Denies vaginal discharge but reports her BF recently checked/treated for STI but not a specific diagnosis yet. She denies any N/V/D. No URI symptoms. Not noted to be febrile at home prior to arrival.    Past Medical History:  Diagnosis Date  . Seizures (HCC)     Past Surgical History:  Procedure Laterality Date  . EYE SURGERY      Family History  Problem Relation Age of Onset  . Cancer Mother     Social History   Tobacco Use  . Smoking status: Current Every Day Smoker    Types: Cigars  . Smokeless tobacco: Never Used  . Tobacco comment: mom smokes around her  Vaping Use  . Vaping Use: Never used  Substance Use Topics  . Alcohol use: Never  . Drug use: Never     Home Medications Prior to Admission medications   Medication Sig Start Date End Date Taking? Authorizing Provider  metroNIDAZOLE (FLAGYL) 500 MG tablet Take 1 tablet (500 mg total) by mouth 2 (two) times daily for 7 days. 12/29/19 01/05/20  Pollyann Savoy, MD     Allergies    Pineapple   Review of Systems   Review of Systems A comprehensive review of systems was completed and negative except as noted in HPI.    Physical Exam BP 114/75   Pulse (!) 103   Temp 98.6 F (37 C)   Resp 19   Ht 5\' 3"  (1.6 m)   Wt 50.7 kg   LMP 12/29/2019   SpO2 98%   BMI 19.80 kg/m   Physical Exam Vitals and nursing note reviewed.  Constitutional:      Appearance: Normal appearance.  HENT:     Head: Normocephalic and atraumatic.      Nose: Nose normal.     Mouth/Throat:     Mouth: Mucous membranes are moist.  Eyes:     Extraocular Movements: Extraocular movements intact.     Conjunctiva/sclera: Conjunctivae normal.  Cardiovascular:     Rate and Rhythm: Normal rate.  Pulmonary:     Effort: Pulmonary effort is normal.     Breath sounds: Normal breath sounds.  Abdominal:     General: Abdomen is flat.     Palpations: Abdomen is soft.     Tenderness: There is abdominal tenderness in the right lower quadrant, suprapubic area and left lower quadrant. There is guarding. Positive signs include McBurney's sign. Negative signs include Murphy's sign.  Musculoskeletal:        General: No swelling. Normal range of motion.     Cervical back: Neck supple.  Skin:    General: Skin is warm and dry.  Neurological:     General: No focal deficit present.     Mental Status: She is alert.  Psychiatric:        Mood and Affect: Mood normal.      ED Results / Procedures / Treatments   Labs (all labs ordered are listed, but only abnormal results are displayed)  Labs Reviewed  WET PREP, GENITAL - Abnormal; Notable for the following components:      Result Value   Clue Cells Wet Prep HPF POC PRESENT (*)    WBC, Wet Prep HPF POC MODERATE (*)    All other components within normal limits  COMPREHENSIVE METABOLIC PANEL - Abnormal; Notable for the following components:   Sodium 134 (*)    Glucose, Bld 111 (*)    Total Protein 8.2 (*)    AST 14 (*)    All other components within normal limits  CBC - Abnormal; Notable for the following components:   WBC 15.2 (*)    All other components within normal limits  URINALYSIS, ROUTINE W REFLEX MICROSCOPIC - Abnormal; Notable for the following components:   Hgb urine dipstick MODERATE (*)    All other components within normal limits  LIPASE, BLOOD  RPR  I-STAT BETA HCG BLOOD, ED ([facility], [facility], [facility] ONLY)  GC/CHLAMYDIA PROBE AMP ([place]) NOT AT [facility]    EKG None  Radiology CT Abdomen  Pelvis W Contrast  Result Date: 12/29/2019 CLINICAL DATA:  19 year old female with right lower quadrant abdominal pain. EXAM: CT ABDOMEN AND PELVIS WITH CONTRAST TECHNIQUE: Multidetector CT imaging of the abdomen and pelvis was performed using the standard protocol following bolus administration of intravenous contrast. CONTRAST:  OMNIPAQUE IOHEXOL 300 MG/ML  SOLN COMPARISON:  None. FINDINGS: Lower chest: The visualized lung bases are clear. No intra-abdominal free air or free fluid. Hepatobiliary: No focal liver abnormality is seen. No gallstones, gallbladder wall thickening, or biliary dilatation. Pancreas: Unremarkable. No pancreatic ductal dilatation or surrounding inflammatory changes. Spleen: Normal in size without focal abnormality. Adrenals/Urinary Tract: The adrenal glands unremarkable. The kidneys, visualized ureters, and urinary bladder appear unremarkable. Stomach/Bowel: There is no bowel obstruction. Mild thickened appearance of the jejunal folds may represent mild enteritis. Clinical correlation is recommended. The appendix is poorly visualized. A somewhat tubular structure in the right lower quadrant posterior to the cecum (coronal 52/5 and axial 60/2) may represent a normal appendix. Vascular/Lymphatic: The abdominal aorta and IVC unremarkable. No portal venous gas. There is no adenopathy. Reproductive: The uterus is anteverted and grossly unremarkable. No adnexal masses. A tampon is noted. Other: None Musculoskeletal: No acute or significant osseous findings. IMPRESSION: 1. Suboptimal evaluation of the appendix. No definite evidence of acute appendicitis. 2. Mild thickened appearance of the jejunal folds may represent mild enteritis. Clinical correlation is recommended. No bowel obstruction. Electronically Signed   By: Elgie Collard M.D.   On: 12/29/2019 18:16    Procedures Procedures  Medications Ordered in the ED Medications  iohexol (OMNIPAQUE) 300 MG/ML solution 100 mL (100  mLs Intravenous Contrast Given 12/29/19 1741)     MDM Rules/Calculators/A&P MDM Patient here with lower abdominal pain/tenderness and fever. Labs done in triage show elevated WBC, normal UA and CMP. Will check pelvic exam to collect swabs and to check for signs of PID.  ED Course  I have reviewed the triage vital signs and the nursing notes.  Pertinent labs & imaging results that were available during my care of the patient were reviewed by me and considered in my medical decision making (see chart for details).  Clinical Course as of Dec 28 2229  Fri Dec 29, 2019  1618 Pelvic: chaperone present, mild old blood consistent with menses, no discharge, no CMT or adnexal tenderness but there is tenderness over Mcburney's point. Will send for CT.    [CS]  1713 Wet prep with clue  cells and WBC. Will need treatment for BV at discharge.    [CS]  1827 CT images and results reviewed. Appendix not well visualized, non-diagnostic for appendicitis. Pain is improved now and no longer guarding in RLQ, will discuss with Gen Surgery.    [CS]  1843 Spoke with Dr Michaell Cowing, discussed exam as well as lab/imaging findings. Given her improvement while in the ED and no further tenderness he does not feel OR or admission for serial exams is necessary so long as she can tolerate PO without significant worsening in pain or vomiting.   [CS]  1941 Patient able to eat crackers without pain. She is comfortable going home. Will give Rx for Flagyl for BV, strict return precautions for worsening pain. GC/CT pending.    [CS]    Clinical Course User Index [CS] Pollyann Savoy, MD    Final Clinical Impression(s) / ED Diagnoses Final diagnoses:  Lower abdominal pain  Bacterial vaginosis    Rx / DC Orders ED Discharge Orders         Ordered    metroNIDAZOLE (FLAGYL) 500 MG tablet  2 times daily        12/29/19 1943           Pollyann Savoy, MD 12/29/19 2231

## 2019-12-29 NOTE — ED Notes (Signed)
Sent 2 gold tops to lab

## 2019-12-29 NOTE — ED Triage Notes (Signed)
Patient c/o lower abdominal pain since yesterday. Patient states pain n the lower abdomen is worse when she urinates.

## 2019-12-30 LAB — RPR: RPR Ser Ql: NONREACTIVE

## 2020-01-01 ENCOUNTER — Encounter: Payer: Self-pay | Admitting: Family Medicine

## 2020-01-01 LAB — GC/CHLAMYDIA PROBE AMP (~~LOC~~) NOT AT ARMC
Chlamydia: NEGATIVE
Comment: NEGATIVE
Comment: NORMAL
Neisseria Gonorrhea: POSITIVE — AB

## 2020-01-02 ENCOUNTER — Encounter (HOSPITAL_COMMUNITY): Payer: Self-pay

## 2020-01-02 ENCOUNTER — Emergency Department (HOSPITAL_COMMUNITY)
Admission: EM | Admit: 2020-01-02 | Discharge: 2020-01-02 | Disposition: A | Discharge status: Home / Self Care | Payer: Self-pay | Attending: Emergency Medicine | Admitting: Emergency Medicine

## 2020-01-02 DIAGNOSIS — R1031 Right lower quadrant pain: Secondary | ICD-10-CM | POA: Insufficient documentation

## 2020-01-02 DIAGNOSIS — Z5321 Procedure and treatment not carried out due to patient leaving prior to being seen by health care provider: Secondary | ICD-10-CM | POA: Insufficient documentation

## 2020-01-02 LAB — CBC
HCT: 38.6 % (ref 36.0–46.0)
Hemoglobin: 12.5 g/dL (ref 12.0–15.0)
MCH: 30.6 pg (ref 26.0–34.0)
MCHC: 32.4 g/dL (ref 30.0–36.0)
MCV: 94.4 fL (ref 80.0–100.0)
Platelets: 211 10*3/uL (ref 150–400)
RBC: 4.09 MIL/uL (ref 3.87–5.11)
RDW: 11.9 % (ref 11.5–15.5)
WBC: 10.5 10*3/uL (ref 4.0–10.5)
nRBC: 0 % (ref 0.0–0.2)

## 2020-01-02 LAB — COMPREHENSIVE METABOLIC PANEL
ALT: 7 U/L (ref 0–44)
AST: 13 U/L — ABNORMAL LOW (ref 15–41)
Albumin: 4.5 g/dL (ref 3.5–5.0)
Alkaline Phosphatase: 48 U/L (ref 38–126)
Anion gap: 10 (ref 5–15)
BUN: 8 mg/dL (ref 6–20)
CO2: 25 mmol/L (ref 22–32)
Calcium: 9.3 mg/dL (ref 8.9–10.3)
Chloride: 99 mmol/L (ref 98–111)
Creatinine, Ser: 0.66 mg/dL (ref 0.44–1.00)
GFR, Estimated: >60 mL/min (ref >60)
Glucose, Bld: 93 mg/dL (ref 70–99)
Potassium: 3.2 mmol/L — ABNORMAL LOW (ref 3.5–5.1)
Sodium: 134 mmol/L — ABNORMAL LOW (ref 135–145)
Total Bilirubin: 0.8 mg/dL (ref 0.3–1.2)
Total Protein: 8.5 g/dL — ABNORMAL HIGH (ref 6.5–8.1)

## 2020-01-02 LAB — I-STAT BETA HCG BLOOD, ED (MC, WL, AP ONLY): I-stat hCG, quantitative: <5 m[IU]/mL (ref <5)

## 2020-01-02 LAB — LIPASE, BLOOD: Lipase: 34 U/L (ref 11–51)

## 2020-01-02 NOTE — ED Triage Notes (Signed)
Patient reports lower abdominal pain X2 days. Patient states she was seen here 2 days ago for same.   Pt states the pain moved from lower/mid abdominal pain to right lower. Pt was told to come back if pain came back to rule out appendicitis.   A/Ox4 Wheelchair in triage.

## 2020-01-03 ENCOUNTER — Other Ambulatory Visit: Payer: Self-pay

## 2020-01-03 ENCOUNTER — Ambulatory Visit (INDEPENDENT_AMBULATORY_CARE_PROVIDER_SITE_OTHER): Payer: Self-pay

## 2020-01-03 DIAGNOSIS — A549 Gonococcal infection, unspecified: Secondary | ICD-10-CM

## 2020-01-03 MED ORDER — CEFTRIAXONE SODIUM 500 MG IJ SOLR
500.0000 mg | Freq: Once | INTRAMUSCULAR | Status: AC
  Administered 2020-01-03: 500 mg via INTRAMUSCULAR

## 2020-01-03 NOTE — Progress Notes (Signed)
Patient in nurse clinic today for STD treatment of Gonorrhea.  Patient advised to abstain from sex for 7-10 days after treatment of self and partner.    Ceftriaxone 250 mg IM x 1 given in RUOQ Ceftriaxone 250 mg IM x 1 given in LUOQ per providers order. Patient observed 15 minutes in office. No reaction noted.   Patient to follow up in 2-3 months for re-screening.

## 2020-04-25 ENCOUNTER — Emergency Department (HOSPITAL_COMMUNITY)
Admission: EM | Admit: 2020-04-25 | Discharge: 2020-04-25 | Disposition: A | Discharge status: Home / Self Care | Payer: Self-pay | Attending: Emergency Medicine | Admitting: Emergency Medicine

## 2020-04-25 DIAGNOSIS — N3090 Cystitis, unspecified without hematuria: Secondary | ICD-10-CM | POA: Insufficient documentation

## 2020-04-25 DIAGNOSIS — F17290 Nicotine dependence, other tobacco product, uncomplicated: Secondary | ICD-10-CM | POA: Insufficient documentation

## 2020-04-25 LAB — URINALYSIS, ROUTINE W REFLEX MICROSCOPIC
Bilirubin Urine: NEGATIVE
Glucose, UA: NEGATIVE mg/dL
Hgb urine dipstick: NEGATIVE
Ketones, ur: 5 mg/dL — AB
Nitrite: NEGATIVE
Protein, ur: 30 mg/dL — AB
Specific Gravity, Urine: 1.029 (ref 1.005–1.030)
pH: 5 (ref 5.0–8.0)

## 2020-04-25 LAB — PREGNANCY, URINE: Preg Test, Ur: NEGATIVE

## 2020-04-25 MED ORDER — NITROFURANTOIN MONOHYD MACRO 100 MG PO CAPS: 100.0000 mg | ORAL_CAPSULE | Freq: Two times a day (BID) | ORAL | 0 refills | Status: DC

## 2020-04-25 NOTE — ED Triage Notes (Signed)
Pt here with c/o urinary frequency just like her other uti's no discharge or bleeding noted

## 2020-04-25 NOTE — Discharge Instructions (Signed)
  Hydration: Symptoms of most illnesses will be intensified and complicated by dehydration. Dehydration can also extend the duration of symptoms. Drink plenty of fluids and get plenty of rest. You should be drinking at least half a liter of water an hour to stay hydrated. Electrolyte drinks (ex. Gatorade, Powerade, Pedialyte) are also encouraged. You should be drinking enough fluids to make your urine light yellow, almost clear. If this is not the case, you are not drinking enough water. Please note that some of the treatments indicated below will not be effective if you are not adequately hydrated. Please take all of your antibiotics until finished!   You may develop abdominal discomfort or diarrhea from the antibiotic.  You may help offset this with probiotics which you can buy or get in yogurt. Do not eat or take the probiotics until 2 hours after your antibiotic.  Follow-up: Follow-up with a primary care provider for any further management of this issue.

## 2020-04-25 NOTE — ED Notes (Signed)
Patient verbalizes understanding of discharge instructions. Opportunity for questioning and answers were provided. Armband removed by staff, pt discharged from ED.  

## 2020-04-25 NOTE — ED Provider Notes (Signed)
[facility] EMERGENCY DEPARTMENT Provider Note   CSN: [id number] Arrival date & time: 04/25/20  1309     History No chief complaint on file.   Carla Bean is a 20 y.o. female.  HPI      Carla Bean is a 20 y.o. female, with a history of seizures, presenting to the ED with suspicion for UTI.  She has been experiencing urinary frequency for the last 2 to 3 days, consistent with previous UTIs. Denies fever/chills, flank/back pain, abdominal pain, dysuria, hematuria, vaginal bleeding/discharge, nausea/vomiting, or any other complaints.      Past Medical History:  Diagnosis Date  . Seizures [facility])     Patient Active Problem List   Diagnosis Date Noted  . 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 Encounter for pregnancy test 06/03/2019  . Screen for STD (sexually transmitted disease) 05/27/2018  . Gonorrhea 12/02/2015  . Chlamydia 12/02/2015  . Vaginal discharge 11/27/2015  . Substance abuse (HCC) 03/25/2012  . Contraception management 11/13/2011    Past Surgical History:  Procedure Laterality Date  . EYE SURGERY       OB History   No obstetric history on file.     Family History  Problem Relation Age of Onset  . Cancer Mother     Social History   Tobacco Use  . Smoking status: Current Every Day Smoker    Types: Cigars  . Smokeless tobacco: Never Used  . Tobacco comment: mom smokes around her  Vaping Use  . Vaping Use: Never used  Substance Use Topics  . Alcohol use: Never  . Drug use: Never    Home Medications Prior to Admission medications   Medication Sig Start Date End Date Taking? Authorizing Provider  nitrofurantoin, macrocrystal-monohydrate, (MACROBID) 100 MG capsule Take 1 capsule (100 mg total) by mouth 2 (two) times daily. 04/25/20  Yes Joy, Shawn C, PA-C    Allergies    Pineapple  Review of Systems   Review of Systems  Constitutional: Negative for chills and fever.  Gastrointestinal: Negative for abdominal pain,  nausea and vomiting.  Genitourinary: Positive for frequency. Negative for dysuria, flank pain, hematuria, vaginal bleeding and vaginal discharge.  Musculoskeletal: Negative for back pain.  Neurological: Negative for weakness.    Physical Exam Updated Vital Signs BP 126/71 (BP Location: Right Arm)   Pulse 63   Temp 98.4 F (36.9 C)   Resp 16   SpO2 100%   Physical Exam Vitals and nursing note reviewed.  Constitutional:      General: She is not in acute distress.    Appearance: She is well-developed. She is not diaphoretic.  HENT:     Head: Normocephalic and atraumatic.  Eyes:     Conjunctiva/sclera: Conjunctivae normal.  Cardiovascular:     Rate and Rhythm: Normal rate and regular rhythm.  Pulmonary:     Effort: Pulmonary effort is normal.  Abdominal:     General: There is no distension.     Palpations: Abdomen is soft.     Tenderness: There is no abdominal tenderness.  Musculoskeletal:     Cervical back: Neck supple.  Skin:    General: Skin is warm and dry.     Coloration: Skin is not pale.  Neurological:     Mental Status: She is alert.  Psychiatric:        Behavior: Behavior normal.     ED Results / Procedures / Treatments   Labs (all labs ordered are listed, but only abnormal results are displayed)  Labs Reviewed  URINALYSIS, ROUTINE W REFLEX MICROSCOPIC - Abnormal; Notable for the following components:      Result Value   APPearance HAZY (*)    Ketones, ur 5 (*)    Protein, ur 30 (*)    Leukocytes,Ua TRACE (*)    Bacteria, UA FEW (*)    Non Squamous Epithelial 0-5 (*)    All other components within normal limits  URINE CULTURE  PREGNANCY, URINE    EKG None  Radiology No results found.  Procedures Procedures   Medications Ordered in ED Medications - No data to display  ED Course  I have reviewed the triage vital signs and the nursing notes.  Pertinent labs & imaging results that were available during my care of the patient were reviewed by  me and considered in my medical decision making (see chart for details).    MDM Rules/Calculators/A&P                          Patient presents with urinary frequency which she states is consistent with previous UTIs. Benign abdominal exam.  Urine culture pending.  The patient was given instructions for home care as well as return precautions. Patient voices understanding of these instructions, accepts the plan, and is comfortable with discharge.   Final Clinical Impression(s) / ED Diagnoses Final diagnoses:  Cystitis    Rx / DC Orders ED Discharge Orders         Ordered    nitrofurantoin, macrocrystal-monohydrate, (MACROBID) 100 MG capsule  2 times daily        04/25/20 [address], PA-C 04/25/20 1433    Arby Barrette, MD 04/26/20 [phone]

## 2020-04-25 NOTE — ED Notes (Signed)
Pt refused urine culture. Education given.

## 2020-04-28 LAB — URINE CULTURE: Culture: 50000 — AB

## 2020-06-13 ENCOUNTER — Encounter: Payer: Self-pay | Admitting: Family Medicine

## 2020-06-25 ENCOUNTER — Ambulatory Visit: Payer: Self-pay | Admitting: Family Medicine

## 2020-09-17 ENCOUNTER — Ambulatory Visit: Payer: Self-pay | Admitting: Family Medicine

## 2020-09-17 ENCOUNTER — Encounter: Payer: Self-pay | Admitting: Family Medicine

## 2020-09-17 NOTE — Progress Notes (Deleted)
    SUBJECTIVE:   CHIEF COMPLAINT / HPI:   Pregnancy Test Patient presents today for pregnancy test.    PERTINENT  PMH / PSH:  Past Medical History:  Diagnosis Date   Seizures (HCC)    OBJECTIVE:   There were no vitals taken for this visit.   General: NAD, pleasant, able to participate in exam Cardiac: RRR, no murmurs. Respiratory: CTAB, normal effort, No wheezes, rales or rhonchi Abdomen: Bowel sounds present, nontender, nondistended, no hepatosplenomegaly. Extremities: no edema or cyanosis. Skin: warm and dry, no rashes noted Neuro: alert, no obvious focal deficits Psych: Normal affect and mood  ASSESSMENT/PLAN:   No problem-specific Assessment & Plan notes found for this encounter.     Sabino Dick, DO [facility]

## 2020-10-01 NOTE — Progress Notes (Deleted)
No show for appointment today. Called patient and left HIPAA compliant VM. If patient has another missed appointment, will send letter. If patient calls back, please inform her of clinic policy for no shows and cancelations <24 hours.  Shirlean Mylar, MD [facility], PGY-3

## 2020-10-02 ENCOUNTER — Ambulatory Visit: Payer: Self-pay | Admitting: Family Medicine

## 2020-12-05 ENCOUNTER — Encounter: Payer: Self-pay | Admitting: Family Medicine

## 2020-12-05 ENCOUNTER — Ambulatory Visit (INDEPENDENT_AMBULATORY_CARE_PROVIDER_SITE_OTHER): Payer: Self-pay | Admitting: Family Medicine

## 2020-12-05 ENCOUNTER — Other Ambulatory Visit (HOSPITAL_COMMUNITY)
Admission: RE | Admit: 2020-12-05 | Discharge: 2020-12-05 | Disposition: A | Discharge status: Home / Self Care | Payer: Self-pay | Source: Ambulatory Visit | Attending: Family Medicine | Admitting: Family Medicine

## 2020-12-05 ENCOUNTER — Other Ambulatory Visit: Payer: Self-pay

## 2020-12-05 VITALS — BP 100/62 | HR 98 | Ht 63.0 in | Wt 110.8 lb

## 2020-12-05 DIAGNOSIS — Z113 Encounter for screening for infections with a predominantly sexual mode of transmission: Secondary | ICD-10-CM

## 2020-12-05 DIAGNOSIS — Z0000 Encounter for general adult medical examination without abnormal findings: Secondary | ICD-10-CM

## 2020-12-05 DIAGNOSIS — Z3189 Encounter for other procreative management: Secondary | ICD-10-CM

## 2020-12-05 DIAGNOSIS — Z23 Encounter for immunization: Secondary | ICD-10-CM

## 2020-12-05 MED ORDER — PRENATAL VITAMINS 28-0.8 MG PO TABS: 1.0000 | ORAL_TABLET | Freq: Every day | ORAL | 12 refills | Status: DC

## 2020-12-05 NOTE — Patient Instructions (Signed)
It was a pleasure to see you today!  We will get some labs today.  If they are abnormal or we need to do something about them, I will call you.  If they are normal, I will send you a message on MyChart (if it is active) or a letter in the mail.  If you don't hear from [place] in 2 weeks, please call the office  [phone]. Follow up on 01/01/21 for pap smear Get a calendar and write down the first day of your period every month If you have a painful ulcer in your genital area, return for evaluation and testing     Be Well,  Dr. Leary Roca

## 2020-12-06 DIAGNOSIS — Z3189 Encounter for other procreative management: Secondary | ICD-10-CM | POA: Insufficient documentation

## 2020-12-06 DIAGNOSIS — Z0000 Encounter for general adult medical examination without abnormal findings: Secondary | ICD-10-CM | POA: Insufficient documentation

## 2020-12-06 LAB — URINE CYTOLOGY ANCILLARY ONLY
Chlamydia: NEGATIVE
Comment: NEGATIVE
Comment: NORMAL
Neisseria Gonorrhea: NEGATIVE

## 2020-12-06 NOTE — Progress Notes (Signed)
    SUBJECTIVE:   CHIEF COMPLAINT / HPI: concern re: STI and fertility  20 yo woman presents for evaluation as her ex-boyfriend recently informed her that his new partner was diagnosed with genital herpes. He told her that he had a "blood test" that had an "abnormal" result for herpes. She has slept with him in the last several months, but they have broken off their relationship since then. She now has a new partner and is concerned re: STIs. She denies any genital lesions, no genital pain, no vaginal discharge, itching, etc.   She also reports that she has been trying to become pregnant for over a year, but that was with her prior partner. She desires pregnancy and thinks that her periods may be irregular. She is not sure how often they come as she does not track them. They are not heavy or particularly painful, tend to last about 6 days. Her last period was last week.   PERTINENT  PMH / PSH: non-contributory  OBJECTIVE:   BP 100/62   Pulse 98   Ht 5\' 3"  (1.6 m)   Wt 110 lb 12.8 oz (50.3 kg)   LMP  (LMP Unknown)   SpO2 98%   BMI 19.63 kg/m   Nursing note and vitals reviewed GEN: age-appropriate AAW, resting comfortably in chair, NAD, WNWD Neuro: AOx3  Ext: no edema Psych: Pleasant and appropriate  ASSESSMENT/PLAN:   Screen for STD (sexually transmitted disease) Will test for NG/CT with urine NAAT as time constraints prevent pelvic exam today. Not useful to test for HSV given that patient has never had any lesions. I recommend she monitors for any lesions or genital pain and obtain an appt as soon as possible if/when she notices a painful lesion so that she can be evaluated and have direct HSV testing of the lesion for best results. Counseled her that in the absence of never having a lesion, not only can we not reliably test her for herpes in the absence of lesions, but that herpes is really only spread during active lesions. Patient will follow up if lesion appears.     , MD [facility]

## 2020-12-06 NOTE — Assessment & Plan Note (Signed)
Since patient has new partner, 1 year clock is reset. Recommend patient buy a calendar and note the first day of her period every month to track it at home. Without history to accurately track periods, unable to evaluate if it is abnormal or not. Also since patient is self pay, I recommend that she try with this new partner, track periods, take prenatal vitamins, prior to obtain lab work which will be expensive and is not warranted in an otherwise healthy 20 yo at this time. If 1 year passes without pregnancy with new partner, recommend start with sperm testing.

## 2020-12-06 NOTE — Assessment & Plan Note (Signed)
F/u for pap smear scheduled in several weeks

## 2020-12-06 NOTE — Assessment & Plan Note (Signed)
Will test for NG/CT with urine NAAT as time constraints prevent pelvic exam today. Not useful to test for HSV given that patient has never had any lesions. I recommend she monitors for any lesions or genital pain and obtain an appt as soon as possible if/when she notices a painful lesion so that she can be evaluated and have direct HSV testing of the lesion for best results. Counseled her that in the absence of never having a lesion, not only can we not reliably test her for herpes in the absence of lesions, but that herpes is really only spread during active lesions. Patient will follow up if lesion appears.

## 2021-01-01 ENCOUNTER — Ambulatory Visit: Payer: Self-pay | Admitting: Family Medicine

## 2021-04-08 ENCOUNTER — Encounter (HOSPITAL_COMMUNITY): Payer: Self-pay

## 2021-04-08 ENCOUNTER — Other Ambulatory Visit: Payer: Self-pay

## 2021-04-08 ENCOUNTER — Emergency Department (HOSPITAL_COMMUNITY)
Admission: EM | Admit: 2021-04-08 | Discharge: 2021-04-08 | Disposition: A | Discharge status: Home / Self Care | Payer: Self-pay | Attending: Emergency Medicine | Admitting: Emergency Medicine

## 2021-04-08 DIAGNOSIS — R21 Rash and other nonspecific skin eruption: Secondary | ICD-10-CM | POA: Insufficient documentation

## 2021-04-08 MED ORDER — PREDNISONE 20 MG PO TABS
60.0000 mg | ORAL_TABLET | Freq: Once | ORAL | Status: AC
  Administered 2021-04-08: 60 mg via ORAL
  Filled 2021-04-08: qty 3

## 2021-04-08 MED ORDER — PREDNISONE 20 MG PO TABS: 40.0000 mg | ORAL_TABLET | Freq: Every day | ORAL | 0 refills | Status: AC

## 2021-04-08 NOTE — ED Notes (Signed)
Pt NAD, a/ox4. Pt verbalizes understanding of all DC and f/u instructions. All questions answered. Pt walks with steady gait to lobby at DC.  ? ?

## 2021-04-08 NOTE — ED Triage Notes (Signed)
Pt reports with a rash consisting of small bumps to her left leg, both arms, back, and her face x 3 days.  ?

## 2021-04-08 NOTE — ED Provider Notes (Addendum)
?[facility] DEPT ?Provider Note ? ? ?CSN: [id number] ?Arrival date & time: 04/08/21  0105 ? ?  ? ?History ? ?Chief Complaint  ?Patient presents with  ? Rash  ? ? ?Corie Schaecher is a 21 y.o. female with past medical history of seizures not on any medications.  Presents to the emergency department with a chief complaint of rash.  Reports that rash started 1 week prior.  Patient first noticed a rash on her legs.  Rash has now spread throughout the rest of her body.  Patient endorses pruritus associated with her rash.  States that prior to the rash starting she began using a new Dove soap.  Patient reports that she has been using cortisone with improvement in pruritus. ? ?Patient denies any fever, chills, trouble swallowing, trouble breathing, abdominal pain, nausea, vomiting, facial swelling. ? ? ?Rash ?Associated symptoms: no abdominal pain, no fever, no nausea, no shortness of breath and not vomiting   ? ?  ? ?Home Medications ?Prior to Admission medications   ?Medication Sig Start Date End Date Taking? Authorizing Provider  ?nitrofurantoin, macrocrystal-monohydrate, (MACROBID) 100 MG capsule Take 1 capsule (100 mg total) by mouth 2 (two) times daily. 04/25/20   Lorayne Bender, PA-C  ?Prenatal Vit-Fe Fumarate-FA (PRENATAL VITAMINS) 28-0.8 MG TABS Take 1 tablet by mouth daily. 12/05/20   Gladys Damme, MD  ?   ? ?Allergies    ?Pineapple   ? ?Review of Systems   ?Review of Systems  ?Constitutional:  Negative for chills and fever.  ?HENT:  Negative for facial swelling.   ?Respiratory:  Negative for shortness of breath and stridor.   ?Gastrointestinal:  Negative for abdominal pain, nausea and vomiting.  ?Skin:  Positive for rash. Negative for color change, pallor and wound.  ? ?Physical Exam ?Updated Vital Signs ?BP 111/82 (BP Location: Left Arm)   Pulse (!) 112   Temp 98 ?F (36.7 ?C) (Oral)   Resp 18   Ht 5\' 3"  (1.6 m)   Wt 51.3 kg   SpO2 99%   BMI 20.02 kg/m?  ?Physical Exam ?Vitals  and nursing note reviewed.  ?Constitutional:   ?   General: She is not in acute distress. ?   Appearance: She is not ill-appearing, toxic-appearing or diaphoretic.  ?HENT:  ?   Head: Normocephalic.  ?   Jaw: No trismus, swelling or pain on movement.  ?   Comments: No facial swelling ?   Mouth/Throat:  ?   Lips: Pink. No lesions.  ?   Tongue: No lesions. Tongue does not deviate from midline.  ?   Palate: No mass and lesions.  ?   Pharynx: Oropharynx is clear. Uvula midline. No pharyngeal swelling, oropharyngeal exudate, posterior oropharyngeal erythema or uvula swelling.  ?   Tonsils: No tonsillar exudate or tonsillar abscesses. 1+ on the right. 1+ on the left.  ?   Comments: Handles all secretions without difficulty ?Eyes:  ?   General: No scleral icterus.    ?   Right eye: No discharge.     ?   Left eye: No discharge.  ?Cardiovascular:  ?   Rate and Rhythm: Normal rate.  ?Pulmonary:  ?   Effort: Pulmonary effort is normal. No respiratory distress.  ?   Breath sounds: Normal breath sounds. No stridor.  ?   Comments: Speaks in full complete sentences without difficulty ?Skin: ?   General: Skin is warm and dry.  ?   Findings: Rash present. No petechiae. Rash  is papular. Rash is not crusting, macular, nodular, purpuric, pustular, scaling, urticarial or vesicular.  ?   Comments: Papular rash noted to bilateral lower legs, abdomen, chest, back, bilateral upper arms, neck and face.  ?Neurological:  ?   General: No focal deficit present.  ?   Mental Status: She is alert.  ?Psychiatric:     ?   Behavior: Behavior is cooperative.  ? ? ? ? ?ED Results / Procedures / Treatments   ?Labs ?(all labs ordered are listed, but only abnormal results are displayed) ?Labs Reviewed - No data to display ? ?EKG ?None ? ?Radiology ?No results found. ? ?Procedures ?Procedures  ? ? ?Medications Ordered in ED ?Medications  ?predniSONE (DELTASONE) tablet 60 mg (60 mg Oral Given 04/08/21 0231)  ? ? ?ED Course/ Medical Decision Making/ A&P ?  ?                         ?Medical Decision Making ?Risk ?Prescription drug management. ? ? ?Alert 21 year old female in no acute distress, nontoxic-appearing.  Presents to the emergency department with a chief complaint of rash. ? ?Information is obtained from patient and patient's wife at bedside.  Past medical records were reviewed in epic. ? ?Patient has papular rash noted throughout her entire body.  With diffuse distribution low suspicion for herpes zoster.  No urticaria low suspicion for allergic reaction.  No petechia or purpura noted.  Patient has no rash oral mucosa or bilateral palms, no new medications; low suspicion for SJS. ? ?Suspect that rash may be allergic contact dermatitis versus insect bites.  Due to diffuse nature will start patient on short course of steroids.  Patient given information to follow-up with dermatology in the outpatient setting. ? ?Discussed results, findings, treatment and follow up. Patient advised of return precautions. Patient verbalized understanding and agreed with plan. ? ? ? ? ? ? ? ? ?Final Clinical Impression(s) / ED Diagnoses ?Final diagnoses:  ?Rash  ? ? ?Rx / DC Orders ?ED Discharge Orders   ? ?      Ordered  ?  predniSONE (DELTASONE) 20 MG tablet  Daily       ? 04/08/21 0208  ? ?  ?  ? ?  ? ? ?  ?Loni Beckwith, PA-C ?04/08/21 0201 ? ?  ?Loni Beckwith, PA-C ?04/08/21 [id number] ? ?  ?Fatima Blank, MD ?04/08/21 0730 ? ?

## 2021-04-08 NOTE — Discharge Instructions (Addendum)
You came to the emergency department today to be evaluated for your rash.  Due to your rash she was started on steroids.  Please follow-up with the dermatologist listed on this paperwork below. ? ?Get help right away if you: ?Have a fever and your symptoms suddenly get worse. ?Develop confusion. ?Have a severe headache or a stiff neck. ?Have severe joint pains or stiffness. ?Have a seizure. ?Develop a rash that covers all or most of your body. The rash may or may not be painful. ?Develop blisters that: ?Are on top of the rash. ?Grow larger or grow together. ?Are painful. ?Are inside your nose or mouth. ?Develop a rash that: ?Looks like purple pinprick-sized spots all over your body. ?Has a "bull's eye" or looks like a target. ?Is not related to sun exposure, is red and painful, and causes your skin to peel. ? ?[facility]: ? ? ?[facility]  ?3.2 (9) ? Dermatologist  ?[address]  ?[phone]  ? ?Dr. Mertha Finders, MD  ?2.6 (18) ? Dermatologist  ?[address]  ?[phone] ? ?[facility]  ?3.5 (3) ? [facility]  ?[address] [place]  ?[phone]  ? ?[facility]  ?4.0 (4) ? Dermatologist  ?[address]  ?[phone] ? ?Janalyn Harder MD  ?3.0 (2) ? Dermatologist  ?[address]  ?[phone] ? ?Hoyle Sauer  ?2.7 (6) ? Dermatologist  ?[address] [place]  ?[phone] ? ?[place] Amy Y MD  ?2.0 (1) ? Dermatologist  ?[address] [place]  ?[phone] ? ?[facility]  ?5.0 (3) ? Doctor  ?[address]  ?[phone]  ? ? ? ?

## 2021-06-12 ENCOUNTER — Encounter (HOSPITAL_COMMUNITY): Payer: Self-pay

## 2021-06-12 ENCOUNTER — Emergency Department (HOSPITAL_COMMUNITY)
Admission: EM | Admit: 2021-06-12 | Discharge: 2021-06-13 | Disposition: A | Discharge status: Home / Self Care | Payer: Self-pay | Attending: Emergency Medicine | Admitting: Emergency Medicine

## 2021-06-12 ENCOUNTER — Other Ambulatory Visit: Payer: Self-pay

## 2021-06-12 DIAGNOSIS — E876 Hypokalemia: Secondary | ICD-10-CM | POA: Insufficient documentation

## 2021-06-12 DIAGNOSIS — R519 Headache, unspecified: Secondary | ICD-10-CM | POA: Insufficient documentation

## 2021-06-12 LAB — CBC WITH DIFFERENTIAL/PLATELET
Abs Immature Granulocytes: 0 10*3/uL (ref 0.00–0.07)
Basophils Absolute: 0 10*3/uL (ref 0.0–0.1)
Basophils Relative: 1 %
Eosinophils Absolute: 0.2 10*3/uL (ref 0.0–0.5)
Eosinophils Relative: 4 %
HCT: 34.9 % — ABNORMAL LOW (ref 36.0–46.0)
Hemoglobin: 11.5 g/dL — ABNORMAL LOW (ref 12.0–15.0)
Immature Granulocytes: 0 %
Lymphocytes Relative: 43 %
Lymphs Abs: 2.4 10*3/uL (ref 0.7–4.0)
MCH: 30.6 pg (ref 26.0–34.0)
MCHC: 33 g/dL (ref 30.0–36.0)
MCV: 92.8 fL (ref 80.0–100.0)
Monocytes Absolute: 0.5 10*3/uL (ref 0.1–1.0)
Monocytes Relative: 9 %
Neutro Abs: 2.5 10*3/uL (ref 1.7–7.7)
Neutrophils Relative %: 43 %
Platelets: 196 10*3/uL (ref 150–400)
RBC: 3.76 MIL/uL — ABNORMAL LOW (ref 3.87–5.11)
RDW: 12 % (ref 11.5–15.5)
WBC: 5.6 10*3/uL (ref 4.0–10.5)
nRBC: 0 % (ref 0.0–0.2)

## 2021-06-12 NOTE — ED Triage Notes (Signed)
Pt reports with a migraine x 1 week. Pt states that it starts behind her eyes and goes to her forehead.  ?

## 2021-06-13 LAB — COMPREHENSIVE METABOLIC PANEL
ALT: 11 U/L (ref 0–44)
AST: 16 U/L (ref 15–41)
Albumin: 4 g/dL (ref 3.5–5.0)
Alkaline Phosphatase: 52 U/L (ref 38–126)
Anion gap: 6 (ref 5–15)
BUN: 11 mg/dL (ref 6–20)
CO2: 22 mmol/L (ref 22–32)
Calcium: 9.1 mg/dL (ref 8.9–10.3)
Chloride: 108 mmol/L (ref 98–111)
Creatinine, Ser: 0.66 mg/dL (ref 0.44–1.00)
GFR, Estimated: >60 mL/min (ref >60)
Glucose, Bld: 102 mg/dL — ABNORMAL HIGH (ref 70–99)
Potassium: 3.3 mmol/L — ABNORMAL LOW (ref 3.5–5.1)
Sodium: 136 mmol/L (ref 135–145)
Total Bilirubin: 0.4 mg/dL (ref 0.3–1.2)
Total Protein: 7.8 g/dL (ref 6.5–8.1)

## 2021-06-13 LAB — HCG, QUANTITATIVE, PREGNANCY: hCG, Beta Chain, Quant, S: <1 m[IU]/mL (ref <5)

## 2021-06-13 MED ORDER — POTASSIUM CHLORIDE CRYS ER 20 MEQ PO TBCR
20.0000 meq | EXTENDED_RELEASE_TABLET | Freq: Once | ORAL | Status: AC
  Administered 2021-06-13: 20 meq via ORAL
  Filled 2021-06-13: qty 1

## 2021-06-13 MED ORDER — METOCLOPRAMIDE HCL 5 MG/ML IJ SOLN
10.0000 mg | Freq: Once | INTRAMUSCULAR | Status: AC
  Administered 2021-06-13: 10 mg via INTRAVENOUS
  Filled 2021-06-13: qty 2

## 2021-06-13 MED ORDER — SODIUM CHLORIDE 0.9 % IV BOLUS
500.0000 mL | Freq: Once | INTRAVENOUS | Status: AC
  Administered 2021-06-13: 500 mL via INTRAVENOUS

## 2021-06-13 NOTE — ED Provider Notes (Signed)
?[facility]-EMERGENCY DEPT ?Provider Note ? ? ?CSN: [id number] ?Arrival date & time: 06/12/21  2306 ? ?  ? ?History ? ?Chief Complaint  ?Patient presents with  ? Migraine  ? ? ?Carla Bean is a 21 y.o. female. ? ?The history is provided by the patient.  ?Migraine ?Carla Bean is a 21 y.o. female who presents to the Emergency Department complaining of HA.  She presents to the ED accompanied by her SO for frontal HA for one week.  Gradual in onset, waxes and wanes and worse when she changes positions.  Feels pain for about 20 seconds at a time.  ? ?No fever. Has cough/sneeze ?No vision change, sore throat.  ?No numb/weak. ? ?No injuries.  ?Hx/o seizure as a baby, last seizure at age of 4.  No additional medical problems.  ? ? ?  ? ?Home Medications ?Prior to Admission medications   ?Medication Sig Start Date End Date Taking? Authorizing Provider  ?nitrofurantoin, macrocrystal-monohydrate, (MACROBID) 100 MG capsule Take 1 capsule (100 mg total) by mouth 2 (two) times daily. 04/25/20   Anselm Pancoast, PA-C  ?Prenatal Vit-Fe Fumarate-FA (PRENATAL VITAMINS) 28-0.8 MG TABS Take 1 tablet by mouth daily. 12/05/20   Shirlean Mylar, MD  ?   ? ?Allergies    ?Pineapple   ? ?Review of Systems   ?Review of Systems  ?All other systems reviewed and are negative. ? ?Physical Exam ?Updated Vital Signs ?BP 119/81 (BP Location: Left Arm)   Pulse 82   Temp 99.8 ?F (37.7 ?C) (Oral)   Resp 16   Ht 5\' 3"  (1.6 m)   Wt 55.2 kg   SpO2 99%   BMI 21.58 kg/m?  ?Physical Exam ?Vitals and nursing note reviewed.  ?Constitutional:   ?   Appearance: She is well-developed.  ?HENT:  ?   Head: Normocephalic and atraumatic.  ?   Nose: Nose normal.  ?Eyes:  ?   Extraocular Movements: Extraocular movements intact.  ?   Pupils: Pupils are equal, round, and reactive to light.  ?Cardiovascular:  ?   Rate and Rhythm: Normal rate and regular rhythm.  ?   Heart sounds: No murmur heard. ?Pulmonary:  ?   Effort: Pulmonary effort is  normal. No respiratory distress.  ?   Breath sounds: Normal breath sounds.  ?Abdominal:  ?   Palpations: Abdomen is soft.  ?   Tenderness: There is no abdominal tenderness. There is no guarding or rebound.  ?Musculoskeletal:     ?   General: No tenderness.  ?Skin: ?   General: Skin is warm and dry.  ?Neurological:  ?   Mental Status: She is alert and oriented to person, place, and time.  ?   Comments: No asymmetry of facial movements, visual fields grossly intact.  5/5 strength in all four extremities with sensation to light touch intact in all four extremities.   ?Psychiatric:     ?   Behavior: Behavior normal.  ? ? ?ED Results / Procedures / Treatments   ?Labs ?(all labs ordered are listed, but only abnormal results are displayed) ?Labs Reviewed  ?CBC WITH DIFFERENTIAL/PLATELET - Abnormal; Notable for the following components:  ?    Result Value  ? RBC 3.76 (*)   ? Hemoglobin 11.5 (*)   ? HCT 34.9 (*)   ? All other components within normal limits  ?COMPREHENSIVE METABOLIC PANEL - Abnormal; Notable for the following components:  ? Potassium 3.3 (*)   ? Glucose, Bld 102 (*)   ?  All other components within normal limits  ?HCG, QUANTITATIVE, PREGNANCY  ? ? ?EKG ?None ? ?Radiology ?No results found. ? ?Procedures ?Procedures  ? ? ?Medications Ordered in ED ?Medications  ?metoCLOPramide (REGLAN) injection 10 mg (10 mg Intravenous Given 06/13/21 0033)  ?sodium chloride 0.9 % bolus 500 mL (0 mLs Intravenous Stopped 06/13/21 0132)  ?potassium chloride SA (KLOR-CON M) CR tablet 20 mEq (20 mEq Oral Given 06/13/21 0151)  ? ? ?ED Course/ Medical Decision Making/ A&P ?  ?                        ?Medical Decision Making ?Amount and/or Complexity of Data Reviewed ?Labs: ordered. ? ?Risk ?Prescription drug management. ? ? ?Patient here for evaluation of positional headache.  She is nontoxic-appearing on evaluation and in no acute distress.  She has normal neurologic evaluation.  She was treated with a Reglan in the emergency  department with improvement in her symptoms.  Current clinical picture is not consistent with CVA, subarachnoid hemorrhage, dural sinus thrombosis.  She does have mild hypokalemia, will replace orally.  Plan to discharge home with outpatient follow-up and return precautions. ? ? ? ? ? ? ? ?Final Clinical Impression(s) / ED Diagnoses ?Final diagnoses:  ?Bad headache  ?Hypokalemia  ? ? ?Rx / DC Orders ?ED Discharge Orders   ? ? None  ? ?  ? ? ?  ?Tilden Fossa, MD ?06/13/21 0225 ? ?

## 2021-07-07 ENCOUNTER — Ambulatory Visit: Payer: Self-pay

## 2021-07-08 ENCOUNTER — Encounter: Payer: Self-pay | Admitting: *Deleted

## 2021-09-14 ENCOUNTER — Emergency Department (HOSPITAL_COMMUNITY)
Admission: EM | Admit: 2021-09-14 | Discharge: 2021-09-14 | Disposition: A | Discharge status: Home / Self Care | Payer: Self-pay | Attending: Emergency Medicine | Admitting: Emergency Medicine

## 2021-09-14 ENCOUNTER — Encounter (HOSPITAL_COMMUNITY): Payer: Self-pay

## 2021-09-14 ENCOUNTER — Other Ambulatory Visit: Payer: Self-pay

## 2021-09-14 DIAGNOSIS — R059 Cough, unspecified: Secondary | ICD-10-CM | POA: Insufficient documentation

## 2021-09-14 DIAGNOSIS — Z20822 Contact with and (suspected) exposure to covid-19: Secondary | ICD-10-CM | POA: Insufficient documentation

## 2021-09-14 DIAGNOSIS — R0981 Nasal congestion: Secondary | ICD-10-CM | POA: Insufficient documentation

## 2021-09-14 LAB — RESP PANEL BY RT-PCR (FLU A&B, COVID) ARPGX2
Influenza A by PCR: NEGATIVE
Influenza B by PCR: NEGATIVE
SARS Coronavirus 2 by RT PCR: NEGATIVE

## 2021-09-14 MED ORDER — OXYMETAZOLINE HCL 0.05 % NA SOLN
2.0000 | Freq: Once | NASAL | Status: AC
  Administered 2021-09-14: 2 via NASAL
  Filled 2021-09-14: qty 30

## 2021-09-14 NOTE — ED Provider Notes (Signed)
[facility]-EMERGENCY DEPT Provider Note   CSN: [id number] Arrival date & time: 09/14/21  0955     History Chief Complaint  Patient presents with   Nasal Congestion    Carla Bean is a 21 y.o. female patient who presents to the emergency department for further evaluation of congestion.  This has been going on for 3 days.  She also reports associated dry cough.  No other associated symptoms.  Denies fever, chills, ear pain, sore throat, sinus pain, nausea, vomiting, diarrhea.   HPI     Home Medications Prior to Admission medications   Medication Sig Start Date End Date Taking? Authorizing Provider  nitrofurantoin, macrocrystal-monohydrate, (MACROBID) 100 MG capsule Take 1 capsule (100 mg total) by mouth 2 (two) times daily. 04/25/20   Joy, Shawn C, PA-C  Prenatal Vit-Fe Fumarate-FA (PRENATAL VITAMINS) 28-0.8 MG TABS Take 1 tablet by mouth daily. 12/05/20   Shirlean Mylar, MD      Allergies    Pineapple    Review of Systems   Review of Systems  All other systems reviewed and are negative.   Physical Exam Updated Vital Signs BP 107/68 (BP Location: Right Arm)   Pulse 76   Temp 98.2 F (36.8 C) (Oral)   Resp 16   Ht 5\' 2"  (1.575 m)   Wt 59 kg   SpO2 97%   BMI 23.78 kg/m  Physical Exam Vitals and nursing note reviewed.  Constitutional:      Appearance: Normal appearance.  HENT:     Head: Normocephalic and atraumatic.  Eyes:     General:        Right eye: No discharge.        Left eye: No discharge.     Conjunctiva/sclera: Conjunctivae normal.  Pulmonary:     Effort: Pulmonary effort is normal. No respiratory distress.     Breath sounds: Normal breath sounds. No wheezing, rhonchi or rales.  Skin:    General: Skin is warm and dry.     Findings: No rash.  Neurological:     General: No focal deficit present.     Mental Status: She is alert.  Psychiatric:        Mood and Affect: Mood normal.        Behavior: Behavior normal.      ED Results / Procedures / Treatments   Labs (all labs ordered are listed, but only abnormal results are displayed) Labs Reviewed  RESP PANEL BY RT-PCR (FLU A&B, COVID) [id number]    EKG None  Radiology No results found.  Procedures Procedures    Medications Ordered in ED Medications  oxymetazoline (AFRIN) 0.05 % nasal spray 2 spray (2 sprays Each Nare Given 09/14/21 1110)    ED Course/ Medical Decision Making/ A&P                           Medical Decision Making Carla Bean is a 21 y.o. female patient presents to the emergency department with a 3-day history of nasal congestion.  Have a low suspicion for pneumonia at this time.  Patient's main concern is just nasal congestion.  We will give her 2 sprays of Afrin in each nare.  Do not feel imaging is warranted at this time.  We will swab for COVID and flu and discharge.  She can follow-up with her results on MyChart.  Patient amenable with this plan.  Swab collected.  She is safe for discharge  at this time.  Risk OTC drugs.   Final Clinical Impression(s) / ED Diagnoses Final diagnoses:  Nasal congestion    Rx / DC Orders ED Discharge Orders     None         Jolyn Lent 09/14/21 1124    Terrilee Files, MD 09/14/21 1737

## 2021-09-14 NOTE — ED Triage Notes (Signed)
Pt reports "stuffy nose" and cough x 3 days. Denies pain

## 2021-09-14 NOTE — Discharge Instructions (Signed)
Please drink plenty of fluids and get plenty of rest.  Follow-up with your swab results on MyChart.  You can take the Afrin spray and use it for the next 2 days.  Do not use longer than a total of 3 days.  Return to the emergency room for any worsening symptoms.  This is likely a viral infection that resolved on its own.

## 2021-09-22 ENCOUNTER — Other Ambulatory Visit (HOSPITAL_COMMUNITY)
Admission: RE | Admit: 2021-09-22 | Discharge: 2021-09-22 | Disposition: A | Discharge status: Home / Self Care | Payer: Self-pay | Source: Ambulatory Visit | Attending: Family Medicine | Admitting: Family Medicine

## 2021-09-22 ENCOUNTER — Ambulatory Visit (INDEPENDENT_AMBULATORY_CARE_PROVIDER_SITE_OTHER): Payer: Self-pay | Admitting: Family Medicine

## 2021-09-22 VITALS — BP 99/70 | HR 91 | Ht 62.0 in | Wt 118.1 lb

## 2021-09-22 DIAGNOSIS — N898 Other specified noninflammatory disorders of vagina: Secondary | ICD-10-CM | POA: Insufficient documentation

## 2021-09-22 DIAGNOSIS — Z111 Encounter for screening for respiratory tuberculosis: Secondary | ICD-10-CM

## 2021-09-22 DIAGNOSIS — Z202 Contact with and (suspected) exposure to infections with a predominantly sexual mode of transmission: Secondary | ICD-10-CM | POA: Insufficient documentation

## 2021-09-22 DIAGNOSIS — Z124 Encounter for screening for malignant neoplasm of cervix: Secondary | ICD-10-CM

## 2021-09-22 LAB — POCT WET PREP (WET MOUNT): Clue Cells Wet Prep Whiff POC: POSITIVE

## 2021-09-22 MED ORDER — METRONIDAZOLE 500 MG PO TABS: 500.0000 mg | ORAL_TABLET | Freq: Two times a day (BID) | ORAL | 0 refills | Status: AC

## 2021-09-22 MED ORDER — FLUCONAZOLE 150 MG PO TABS: 150.0000 mg | ORAL_TABLET | Freq: Once | ORAL | 0 refills | Status: AC

## 2021-09-22 NOTE — Patient Instructions (Addendum)
I will call you with the results of your testing.  Your TB test will be done via blood work.  Your Pap smear and other test results will take a few days to come back, I will let you know via MyChart or phone call if they are abnormal..   If at any point you are interested in birth control, please not hesitate to reach out to our office.

## 2021-09-22 NOTE — Addendum Note (Signed)
Addended by: Jennette Bill on: 09/22/2021 12:13 PM   Modules accepted: Orders

## 2021-09-22 NOTE — Progress Notes (Signed)
    SUBJECTIVE:   CHIEF COMPLAINT / HPI:   STD testing/Vaginal discharge - Present for a few days - White discharge with some itching - No new partners recently - Desires vaginal and blood testing - Declines birth control  TB testing - Required for nursing   Healthcare maintenance - Due for pap smear  PERTINENT  PMH / PSH: Reviewed  OBJECTIVE:   Ht 5\' 2"  (1.575 m)   Wt 118 lb 2 oz (53.6 kg)   BMI 21.61 kg/m   General: NAD, well-appearing, well-nourished Respiratory: No respiratory distress, breathing comfortably, able to speak in full sentences Skin: warm and dry, no rashes noted on exposed skin Psych: Appropriate affect and mood Pelvic exam: VULVA: normal appearing vulva with no masses, tenderness or lesions, VAGINA: vaginal discharge - white and curd-like, CERVIX: normal appearing cervix without discharge or lesions, PAP: Pap smear done today, WET MOUNT done - results: KOH done, clue cells, excessive bacteria, trichomonads, exam chaperoned by , CMA.   ASSESSMENT/PLAN:   Vaginal discharge Appears consistent with yeast on exam. Wet prep showed trichomoniasis, some yeast and some clue cells present.  Called patient with results and discussed getting partner treated as well.  We will send in prescription for metronidazole as it will treat BV and trichomoniasis. - Metronidazole 500 mg twice daily x7 days - Diflucan 150 mg x 1 to be completed after antibiotic course if still having discharge - No sexual intercourse while being treated - Encouraged partner treatment  STD testing - Gonorrhea/chalmydia collected - RPR and HIV  Healthcare maintenance Pap smear completed today - Will alert patient with results via MyChart  Contraception counseling Discussed briefly with patient. She declines birth control at this time. Advised to return to clinic if she wishes to start.    Aquilla Solian, DO [place] [facility]

## 2021-09-23 LAB — RPR: RPR Ser Ql: NONREACTIVE

## 2021-09-23 LAB — CERVICOVAGINAL ANCILLARY ONLY
Chlamydia: NEGATIVE
Comment: NEGATIVE
Comment: NORMAL
Neisseria Gonorrhea: NEGATIVE

## 2021-09-23 LAB — HIV ANTIBODY (ROUTINE TESTING W REFLEX): HIV Screen 4th Generation wRfx: NONREACTIVE

## 2021-09-25 LAB — QUANTIFERON-TB GOLD PLUS
QuantiFERON Mitogen Value: >10 IU/mL
QuantiFERON Nil Value: 0 IU/mL
QuantiFERON TB1 Ag Value: 0 IU/mL
QuantiFERON TB2 Ag Value: 0 IU/mL
QuantiFERON-TB Gold Plus: NEGATIVE

## 2021-09-26 LAB — CYTOLOGY - PAP
Diagnosis: NEGATIVE
Diagnosis: REACTIVE

## 2021-10-18 ENCOUNTER — Encounter: Payer: Self-pay | Admitting: Family Medicine

## 2022-04-02 ENCOUNTER — Encounter (HOSPITAL_COMMUNITY): Payer: Self-pay

## 2022-04-02 ENCOUNTER — Emergency Department (HOSPITAL_COMMUNITY)
Admission: EM | Admit: 2022-04-02 | Discharge: 2022-04-02 | Disposition: A | Discharge status: Home / Self Care | Payer: 59 | Attending: Emergency Medicine | Admitting: Emergency Medicine

## 2022-04-02 ENCOUNTER — Other Ambulatory Visit: Payer: Self-pay

## 2022-04-02 DIAGNOSIS — L02412 Cutaneous abscess of left axilla: Secondary | ICD-10-CM | POA: Diagnosis present

## 2022-04-02 MED ORDER — OXYCODONE-ACETAMINOPHEN 5-325 MG PO TABS
2.0000 | ORAL_TABLET | Freq: Once | ORAL | Status: AC
  Administered 2022-04-02: 2 via ORAL
  Filled 2022-04-02: qty 2

## 2022-04-02 MED ORDER — ONDANSETRON HCL 4 MG PO TABS: 4.0000 mg | ORAL_TABLET | Freq: Three times a day (TID) | ORAL | 0 refills | Status: AC | PRN

## 2022-04-02 MED ORDER — CLINDAMYCIN HCL 150 MG PO CAPS: 300.0000 mg | ORAL_CAPSULE | Freq: Three times a day (TID) | ORAL | 0 refills | Status: AC

## 2022-04-02 MED ORDER — ONDANSETRON 4 MG PO TBDP
4.0000 mg | ORAL_TABLET | Freq: Once | ORAL | Status: AC
  Administered 2022-04-02: 4 mg via ORAL
  Filled 2022-04-02: qty 1

## 2022-04-02 MED ORDER — LIDOCAINE HCL (PF) 1 % IJ SOLN
5.0000 mL | Freq: Once | INTRAMUSCULAR | Status: AC
  Administered 2022-04-02: 5 mL
  Filled 2022-04-02: qty 30

## 2022-04-02 MED ORDER — OXYCODONE HCL 5 MG PO TABS: 5.0000 mg | ORAL_TABLET | ORAL | 0 refills | Status: AC | PRN

## 2022-04-02 NOTE — ED Triage Notes (Addendum)
Patient noticed a cyst under her left armpit 2 days ago. She said it usually goes away but is still here this time. Quarter size and tender to touch. Taking an antibiotic now for it, cannot remember name.

## 2022-04-02 NOTE — Discharge Instructions (Signed)
Thank you for letting us take care of you today.  We drained the abscess to your armpit.  There was lots of pus that drained out of this abscess.  It will continue to drain over the next few days.  You should have relief of the pain and swelling as it continues to drain.  Please keep this area clean and dry. I am changing your antibiotic to clindamycin.  Please discontinue your previous antibiotic, Bactrim, and start taking the clindamycin as prescribed.  I am also prescribing you a small amount of pain medication.  Please only take this for severe pain.  For mild to moderate pain, you may take over-the-counter pain medication such as Tylenol and ibuprofen.  I recommend taking up to 1000 mg Tylenol every 6 hours and/or ibuprofen 600 mg every 6 hours.  Please do not take more than 4000 mg of Tylenol or 3 200 mg ibuprofen in 1 day. I am also sending some nausea medication should you experience nausea as a side effect of the antibiotic.   As discussed, I am concerned that you may have a condition called hidradenitis suppurativa.  I have attached some information about this condition.  It is very important that you follow-up with dermatology for further assessment of the abscesses that you recurrently have. I have provided the information for several local dermatology clinics you may choose to follow up with or you may go to a clinic of your own choosing.   If you are not significantly getting better, please return to an urgent care or an emergency department in 48 hours for recheck.  I have also provided the names of 2 primary care clinics that you may contact to establish primary care.  If able to get an appointment in this timeframe you may also follow-up with them.  If you develop worsening symptoms such as fevers, streaking or increased redness around your abscess, worsening swelling or pain, or other new, concerning symptoms, please return to nearest emergency department immediately.

## 2022-04-02 NOTE — ED Provider Notes (Signed)
[facility] Provider Note   CSN: [id number] Arrival date & time: 04/02/22  1617     History  Chief Complaint  Patient presents with   Cyst    Carla Bean is a 22 y.o. female with past medical history of seizure disorder who presents to the ED complaining of abscess to left axilla.  She states that she noticed this approximately 3 days ago and had 2 associated episodes of low grade fever.  She was started on unknown antibiotics for this yesterday.  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 No known diagnosis hidradenitis. States that she previously had area drained in ED ~2 months ago but it shortly returned and over the last few days it has "come to a head" and she would like area drained for pain relief. She denies other areas of concern today, nausea, vomiting, or diarrhea.       Home Medications Prior to Admission medications   Medication Sig Start Date End Date Taking? Authorizing Provider  clindamycin (CLEOCIN) 150 MG capsule Take 2 capsules (300 mg total) by mouth 3 (three) times daily for 10 days. 04/02/22 04/12/22 Yes Louann Hopson L, PA-C  ondansetron (ZOFRAN) 4 MG tablet Take 1 tablet (4 mg total) by mouth every 8 (eight) hours as needed for up to 3 days for nausea or vomiting. 04/02/22 04/05/22 Yes Ifeanyi Mickelson L, PA-C  oxyCODONE (ROXICODONE) 5 MG immediate release tablet Take 1 tablet (5 mg total) by mouth every 4 (four) hours as needed for up to 5 days for severe pain. 04/02/22 04/07/22 Yes Beverley Sherrard L, PA-C  nitrofurantoin, macrocrystal-monohydrate, (MACROBID) 100 MG capsule Take 1 capsule (100 mg total) by mouth 2 (two) times daily. 04/25/20   Joy, Shawn C, PA-C  Prenatal Vit-Fe Fumarate-FA (PRENATAL VITAMINS) 28-0.8 MG TABS Take 1 tablet by mouth daily. 12/05/20    Gladys Damme, MD      Allergies    Pineapple    Review of Systems   Review of Systems  All other systems reviewed and are negative.   Physical Exam Updated Vital Signs BP 121/84 (BP Location: Left Arm)   Pulse 90   Temp 99.1 F (37.3 C) (Oral)   Resp 16   Ht '5\' 2"'$  (1.575 m)   Wt 54.9 kg   SpO2 100%   BMI 22.13 kg/m  Physical Exam Vitals and nursing note reviewed.  Constitutional:      General: She is not in acute distress.    Appearance: Normal appearance. She is not toxic-appearing.  HENT:     Head: Normocephalic and atraumatic.     Mouth/Throat:     Mouth: Mucous membranes are moist.  Eyes:     Conjunctiva/sclera: Conjunctivae normal.  Cardiovascular:     Rate and Rhythm: Normal rate and regular rhythm.     Heart sounds: No murmur heard. Pulmonary:     Effort: Pulmonary effort is normal.     Breath sounds: Normal breath sounds.  Abdominal:     General: Abdomen is flat.     Palpations: Abdomen is soft.     Tenderness: There is no abdominal tenderness.  Musculoskeletal:        General: Normal range of motion.     Cervical back: Normal range of motion and neck supple.  Right lower leg: No edema.     Left lower leg: No edema.     Comments: ~4cm area induration to left axilla with small pinpoint area of fluctuance around the center, minimal surrounding erythema, significant tenderness, no active drainage  Skin:    General: Skin is warm and dry.     Capillary Refill: Capillary refill takes less than 2 seconds.  Neurological:     Mental Status: She is alert. Mental status is at baseline.  Psychiatric:        Behavior: Behavior normal.     ED Results / Procedures / Treatments   Labs (all labs ordered are listed, but only abnormal results are displayed) Labs Reviewed - No data to display  EKG None  Radiology No results found.  Procedures .Marland KitchenIncision and Drainage  Date/Time: 04/02/2022 5:30 PM  Performed by: Suzzette Righter, PA-C Authorized  by: Suzzette Righter, PA-C   Consent:    Consent obtained:  Verbal   Consent given by:  Patient   Risks, benefits, and alternatives were discussed: yes     Risks discussed:  Bleeding, incomplete drainage, pain and infection   Alternatives discussed:  No treatment, delayed treatment, alternative treatment and observation Universal protocol:    Procedure explained and questions answered to patient or proxy's satisfaction: yes     Immediately prior to procedure, a time out was called: yes     Patient identity confirmed:  Verbally with patient Location:    Type:  Abscess   Location: left axilla. Pre-procedure details:    Skin preparation:  Povidone-iodine Sedation:    Sedation type:  None Anesthesia:    Anesthesia method:  Local infiltration   Local anesthetic:  Lidocaine 1% w/o epi Procedure type:    Complexity:  Simple Procedure details:    Ultrasound guidance: no     Needle aspiration: no     Incision types:  Stab incision   Incision depth:  Subcutaneous   Wound management:  Extensive cleaning and probed and deloculated   Drainage:  Bloody and purulent   Drainage amount:  Copious   Wound treatment:  Wound left open   Packing materials:  None Post-procedure details:    Procedure completion:  Tolerated well, no immediate complications     Medications Ordered in ED Medications  oxyCODONE-acetaminophen (PERCOCET/ROXICET) 5-325 MG per tablet 2 tablet (2 tablets Oral Given 04/02/22 1654)  ondansetron (ZOFRAN-ODT) disintegrating tablet 4 mg (4 mg Oral Given 04/02/22 1654)  lidocaine (PF) (XYLOCAINE) 1 % injection 5 mL (5 mLs Infiltration Given 04/02/22 1736)    ED Course/ Medical Decision Making/ A&P                             Medical Decision Making Risk Prescription drug management.   Medical Decision Making:   Carla Bean is a 22 y.o. female who presented to the ED today with left axilla abscess detailed above.    Additional history discussed with patient's  family/caregivers.  Patient's presentation is complicated by their history of recurrent abscesses.  Complete initial physical exam performed, notably the patient  was with pinpoint area of fluctuance surrounded by proximately 4 cm area of induration to the left axilla.   Mild surrounding erythema, exquisite tenderness to palpation. Reviewed and confirmed nursing documentation for past medical history, family history, social history.    Initial Assessment:   With the patient's presentation of abscess, most likely diagnosis is complicated abscess. Other  diagnoses were considered including (but not limited to) cyst, cellulitis, hidradenitis suppurativa, systemic infection. These are considered less likely due to history of present illness and physical exam findings.   This is most consistent with an acute complicated illness  Initial Plan:  Pain management Lidocaine infiltration for anesthesia Discussed with patient different possibilities of draining area of concern, patient stated that she does not desire incision and will instead like to try local infiltration with lidocaine with multiple stab incisions and direct pressure and attempts to drain abscess Objective evaluation as reviewed   Initial Study Results:    Final Assessment and Plan:   This is a 22 year old female presenting to the ED with abscess to left axilla.  Patient has history of recurrent abscesses to that in other areas.  She was evaluated for this previously in multiple emergency departments and by primary care who recommended that she follow-up with dermatology.  She has not previously followed up with dermatology.  Another outpatient provider started her on Bactrim yesterday but she has had no relief since starting these antibiotics. On exam, pt has area to left axilla that is exquisitely tender to palpation with pinpoint area fluctuance and significant surrounding induration.  Vital signs stable.  She desires drainage. Discussed  with pt high suspicion for hidradenitis suppurativa  as well as risks/benefits of drainage. She expressed she does not desire to have complete incision as this does not usually provide significant relief but she would like to have local infiltration with lidocaine and attempt at drainage using multiple stab incisions. She also expressed in the past she has been on Clindamycin antibiotic therapy instead of Bactrim with better results. Proceeded with procedure as above and was able to drain copious amounts of foul smelling, purulent drainage from wound. Pt tolerated procedure but did have significant pain.  Extensive discussion with patient the importance of dermatology follow-up.  Will change antibiotic therapy to clindamycin and that pain medication for home in addition to nausea medication with potential for side effects of clindamycin therapy.  Patient aware of the importance of completing this therapy as prescribed and have close follow-up with dermatology.  Given strict ED return precautions.  All questions answered and patient stable for discharge.   Clinical Impression:  1. Abscess of axilla, left      Discharge           Final Clinical Impression(s) / ED Diagnoses Final diagnoses:  Abscess of axilla, left    Rx / DC Orders ED Discharge Orders          Ordered    clindamycin (CLEOCIN) 150 MG capsule  3 times daily        04/02/22 1759    oxyCODONE (ROXICODONE) 5 MG immediate release tablet  Every 4 hours PRN        04/02/22 1759    ondansetron (ZOFRAN) 4 MG tablet  Every 8 hours PRN        04/02/22 1759              Suzzette Righter, PA-C 04/02/22 1815    Blanchie Dessert, MD 04/06/22 [id number]

## 2022-04-22 ENCOUNTER — Encounter: Payer: Self-pay | Admitting: Family Medicine

## 2022-04-28 NOTE — Telephone Encounter (Signed)
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  Patient states she is not currently having oral or genital rash and does not have vaginal burning, itching, discharge.  Reports she tested negative for other sexually-transmitted infections at urgent care.  Explained that she is not currently having acute HSV outbreak she does not require treatment.  Will consider chronic antiviral suppression if she has frequent outbreaks.

## 2022-08-06 IMAGING — CT CT ABD-PELV W/ CM
2 of 4 series · 16 of 46 positions shown, 18 images · IV contrast (omnipaque)
Comparison: None.

CLINICAL DATA: 19-year-old female with right lower quadrant
abdominal pain.

EXAM:
CT ABDOMEN AND PELVIS WITH CONTRAST
TECHNIQUE: Multidetector CT imaging of the abdomen and pelvis was performed
using the standard protocol following bolus administration of
intravenous contrast.
CONTRAST:  100mL OMNIPAQUE IOHEXOL 300 MG/ML  SOLN

[Series 2: axial st · axial · 0.75mm/px · z∈[+872,+1282]mm · 13 of 92 slices shown, 15 images]
[im 5/92  soft-tissue]
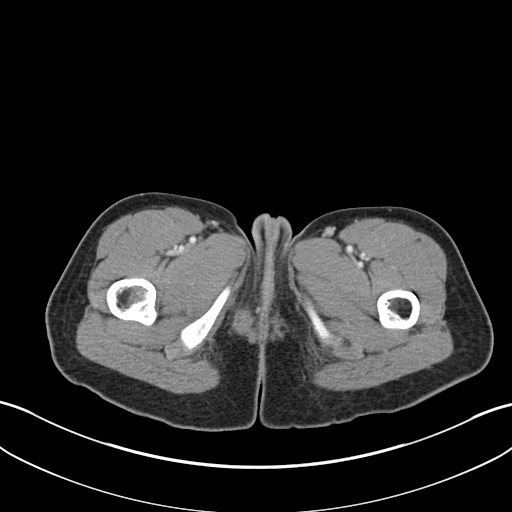
[im 5/92  bone]
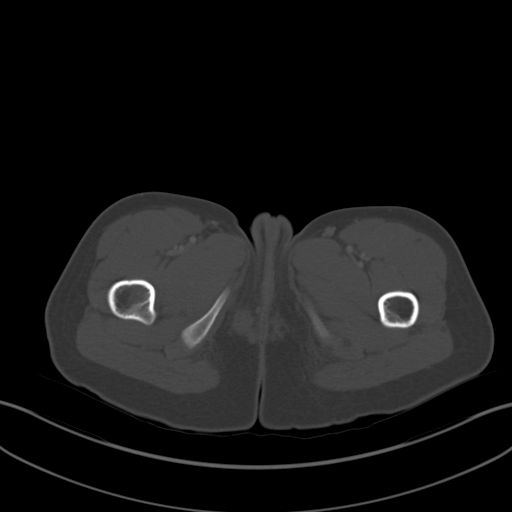
[im 14/92  soft-tissue]
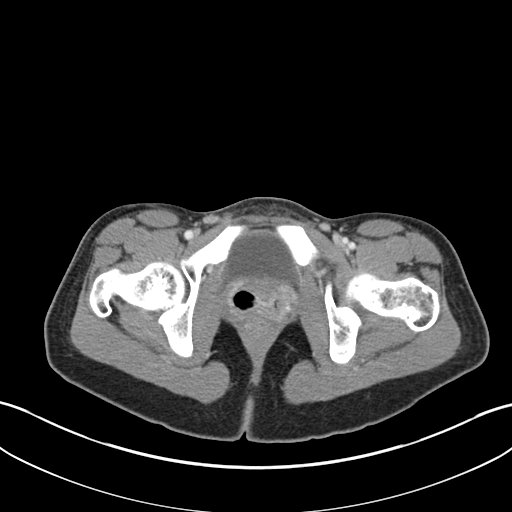
[im 19/92  soft-tissue]
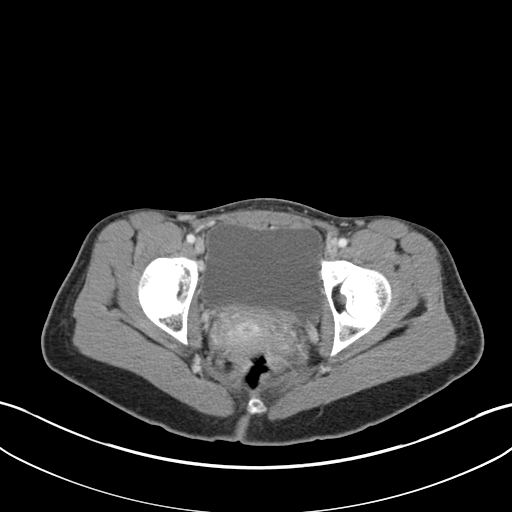
[im 28/92  soft-tissue]
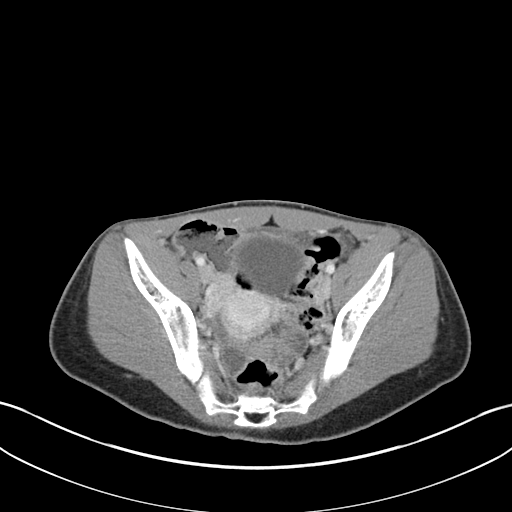
[im 32/92  soft-tissue]
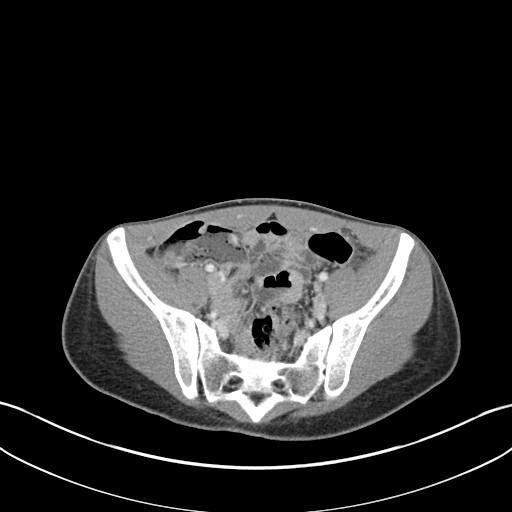
[im 41/92  soft-tissue]
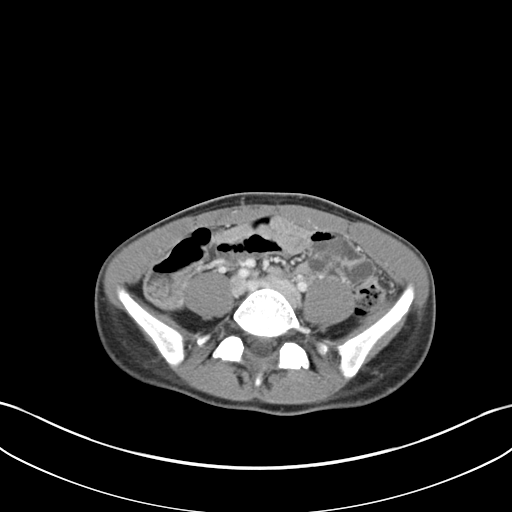
[im 46/92  soft-tissue]
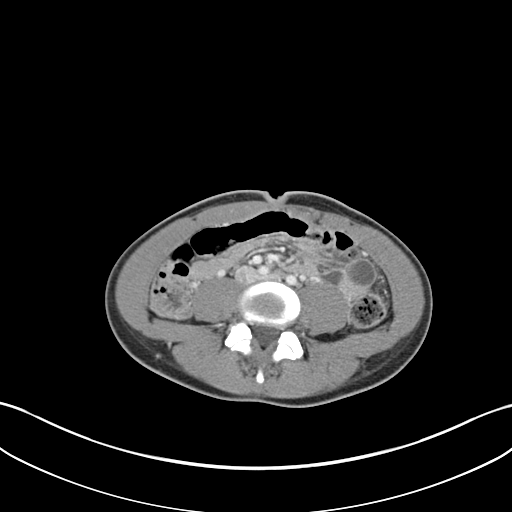
[im 51/92  soft-tissue]
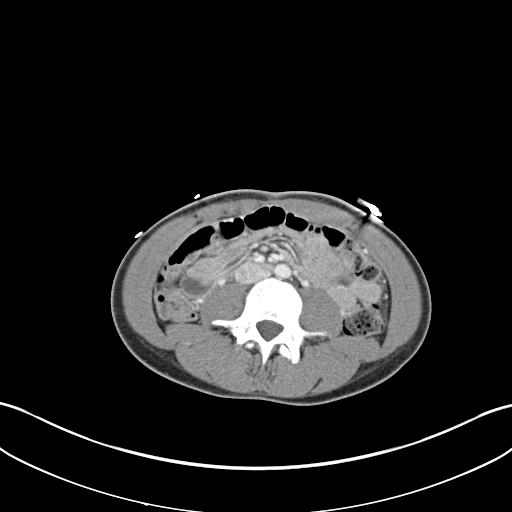
[im 60/92  soft-tissue]
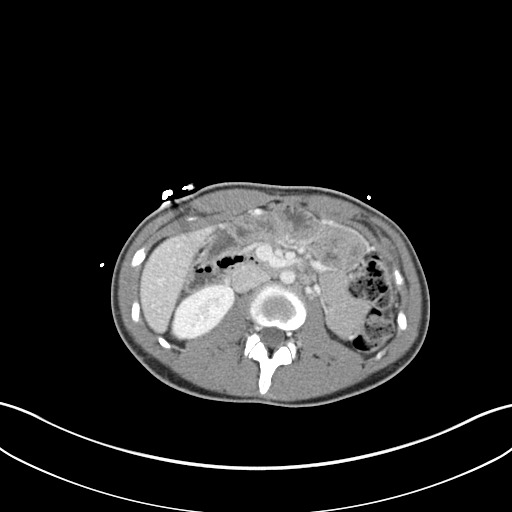
[im 60/92  bone]
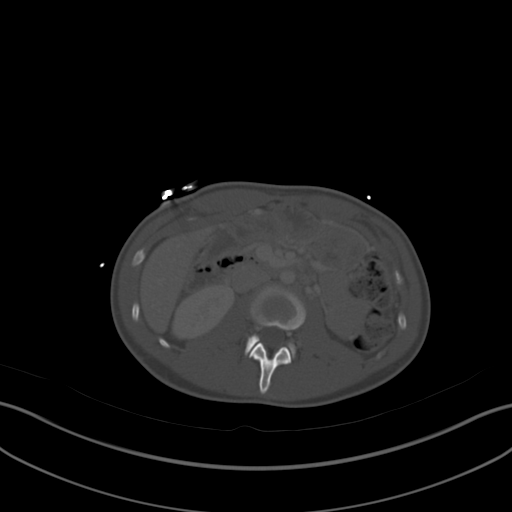
[im 64/92  soft-tissue]
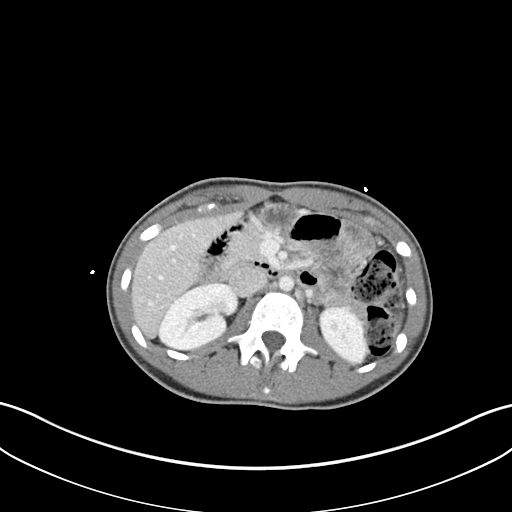
[im 73/92  soft-tissue]
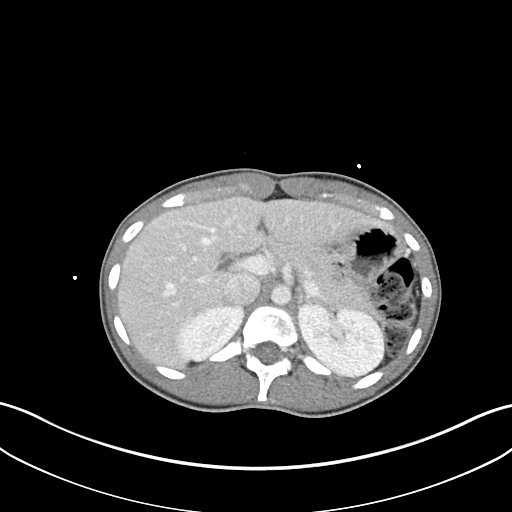
[im 78/92  soft-tissue]
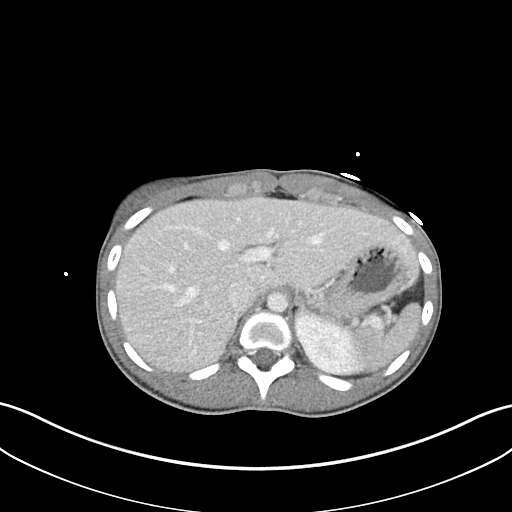
[im 87/92  soft-tissue]
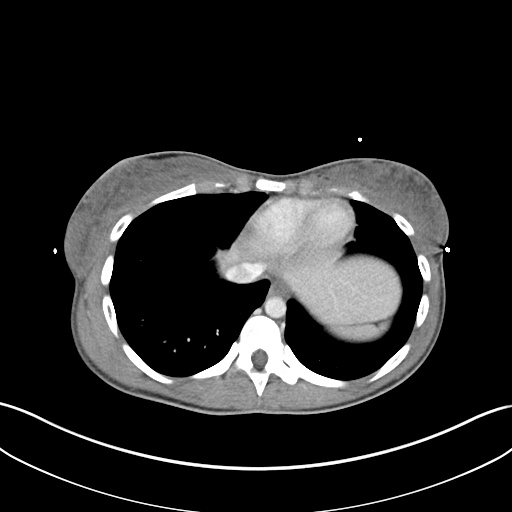

[Series 5: coronal st · coronal · 0.73mm/px · 3 of 123 slices shown]
[im 41/123  soft-tissue]
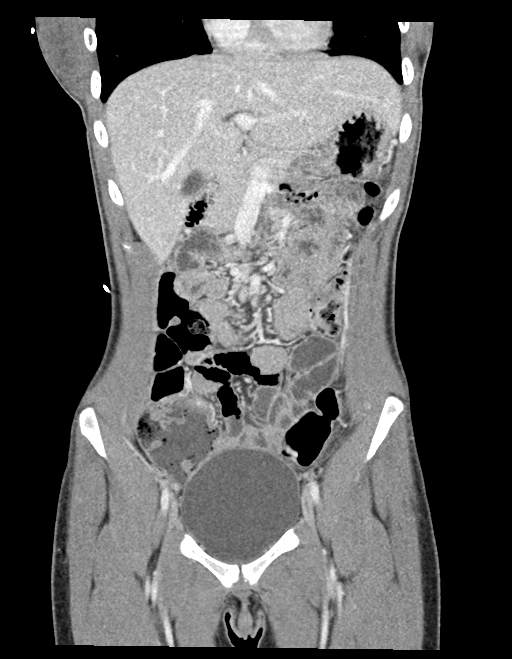
[im 55/123  soft-tissue]
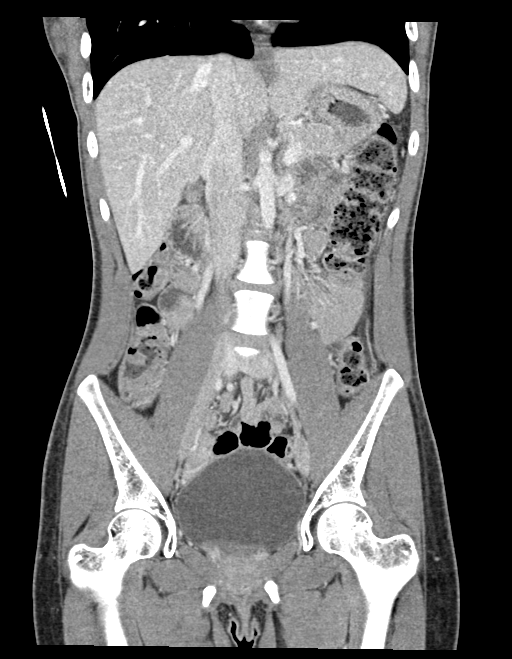
[im 68/123  soft-tissue]
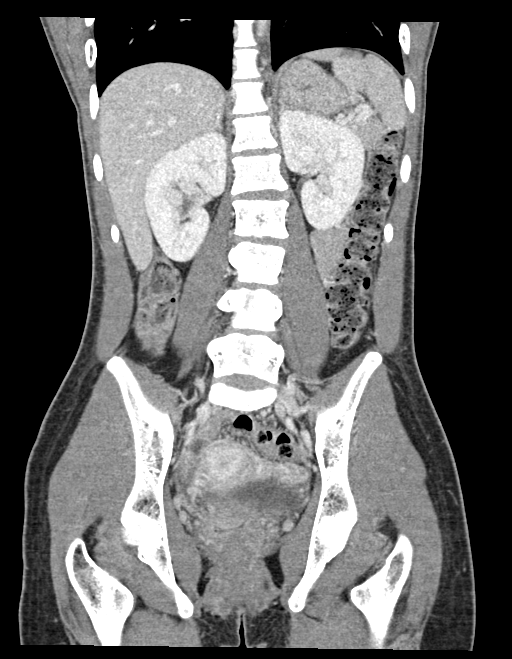

[16 of 46 positions shown; findings below may reference images not displayed]

FINDINGS: Lower chest: The visualized lung bases are clear.

No intra-abdominal free air or free fluid.

Hepatobiliary: No focal liver abnormality is seen. No gallstones,
gallbladder wall thickening, or biliary dilatation.

Pancreas: Unremarkable. No pancreatic ductal dilatation or
surrounding inflammatory changes.

Spleen: Normal in size without focal abnormality.

Adrenals/Urinary Tract: The adrenal glands unremarkable. The
kidneys, visualized ureters, and urinary bladder appear
unremarkable.

Stomach/Bowel: There is no bowel obstruction. Mild thickened
appearance of the jejunal folds may represent mild enteritis.
Clinical correlation is recommended. The appendix is poorly
visualized. A somewhat tubular structure in the right lower quadrant
posterior to the cecum (coronal 52/5 and axial [DATE] represent a
normal appendix.

Vascular/Lymphatic: The abdominal aorta and IVC unremarkable. No
portal venous gas. There is no adenopathy.

Reproductive: The uterus is anteverted and grossly unremarkable. No
adnexal masses. A tampon is noted.

Other: None

Musculoskeletal: No acute or significant osseous findings.
IMPRESSION: 1. Suboptimal evaluation of the appendix. No definite evidence of
acute appendicitis.
2. Mild thickened appearance of the jejunal folds may represent mild
enteritis. Clinical correlation is recommended. No bowel
obstruction.

## 2022-09-01 ENCOUNTER — Ambulatory Visit (INDEPENDENT_AMBULATORY_CARE_PROVIDER_SITE_OTHER): Payer: 59 | Admitting: Family Medicine

## 2022-09-01 ENCOUNTER — Encounter: Payer: Self-pay | Admitting: Family Medicine

## 2022-09-01 VITALS — BP 96/72 | HR 100 | Ht 62.0 in | Wt 112.0 lb

## 2022-09-01 DIAGNOSIS — Z0000 Encounter for general adult medical examination without abnormal findings: Secondary | ICD-10-CM

## 2022-09-01 DIAGNOSIS — Z23 Encounter for immunization: Secondary | ICD-10-CM

## 2022-09-01 DIAGNOSIS — Z711 Person with feared health complaint in whom no diagnosis is made: Secondary | ICD-10-CM

## 2022-09-01 NOTE — Patient Instructions (Addendum)
Please call our office to schedule a nurse visit at your earliest convenience to receive to get up-to-date.  You are due for hepatitis A, meningococcal, MMR, and varicella vaccines.  You received the HPV vaccine today.  Please continue to cut down on vaping.

## 2022-09-01 NOTE — Progress Notes (Signed)
  SUBJECTIVE:   CHIEF COMPLAINT / HPI:   Here for physical  Also had "lump on chest" - first noticed 2 days ago on her left upper chest but has since gone away. Was briefly painful but has now resolved. She did not notice any overlying discoloration. She did not see any mass or fluid collection or zit. Denies fevers.  Currently vapes regularly - used to smoke cigarettes but has since switched to vaping. No nicotine in her vapes as far as she knows. She is doing her best to cut back on vaping as well.   LMP: unsure - typically occur every month  Next PAP due 2026  Would like to defer hep c to next visit  Drinks alcohol socially No concerns about weight Walks for exercise No IV drug use, no concern for STIs Feels safe in her relationship  PERTINENT  PMH / PSH:   Past Medical History:  Diagnosis Date   Seizures (HCC)     OBJECTIVE:  BP 96/72   Pulse 100   Ht 5\' 2"  (1.575 m)   Wt 112 lb (50.8 kg)   LMP  (LMP Unknown)   SpO2 100%   BMI 20.49 kg/m   General: NAD, pleasant, able to participate in exam Cardiac: RRR, no murmurs auscultated. Chest wall: nontender to palpation, no masses noted  Respiratory: CTAB, normal WOB Abdomen: soft, non-tender, non-distended, normoactive bowel sounds Extremities: warm and well perfused, no edema or cyanosis Skin: warm and dry, no rashes noted. Neuro: alert, no obvious focal deficits, speech normal Psych: Normal affect and mood  ASSESSMENT/PLAN:   1. Encounter for well adult exam without abnormal findings 2.  Concern about musculoskeletal disease without diagnosis Overall healthy, no major concerns identified today. Discussed vaping cessation. She did raise a concern of a "knot" that appeared on her chest but my exam is unrevealing today.  She may have had a bout of costochondritis that has since resolved.  Discussed return precautions. Received only HPV vaccine today per pt preference - encouraged pt to schedule RN visit for hepatitis  A, meningococcal, MMR, varicella, meningococcal B vaccines at her convenience.  Due for PAP 2026 Due for hep C - at next visit per patient request   No orders of the defined types were placed in this encounter.  Return in about 1 year (around 09/01/2023), or if symptoms worsen or fail to improve.  Vonna Drafts, MD [facility]

## 2022-10-02 ENCOUNTER — Encounter: Payer: Self-pay | Admitting: Family Medicine

## 2022-10-02 MED ORDER — PRENATAL VITAMINS 28-0.8 MG PO TABS: 1.0000 | ORAL_TABLET | Freq: Every day | ORAL | 12 refills | Status: DC

## 2023-01-13 ENCOUNTER — Encounter: Payer: Self-pay | Admitting: Family Medicine

## 2023-01-15 ENCOUNTER — Other Ambulatory Visit (HOSPITAL_COMMUNITY)
Admission: RE | Admit: 2023-01-15 | Discharge: 2023-01-15 | Disposition: A | Discharge status: Home / Self Care | Payer: Self-pay | Source: Ambulatory Visit | Attending: Family Medicine | Admitting: Family Medicine

## 2023-01-15 ENCOUNTER — Encounter: Payer: Self-pay | Admitting: Family Medicine

## 2023-01-15 ENCOUNTER — Ambulatory Visit (INDEPENDENT_AMBULATORY_CARE_PROVIDER_SITE_OTHER): Payer: Self-pay | Admitting: Family Medicine

## 2023-01-15 ENCOUNTER — Other Ambulatory Visit: Payer: Self-pay

## 2023-01-15 VITALS — BP 103/68 | HR 96 | Ht 62.0 in | Wt 116.6 lb

## 2023-01-15 DIAGNOSIS — F17200 Nicotine dependence, unspecified, uncomplicated: Secondary | ICD-10-CM

## 2023-01-15 DIAGNOSIS — N898 Other specified noninflammatory disorders of vagina: Secondary | ICD-10-CM | POA: Insufficient documentation

## 2023-01-15 DIAGNOSIS — B9689 Other specified bacterial agents as the cause of diseases classified elsewhere: Secondary | ICD-10-CM

## 2023-01-15 DIAGNOSIS — N760 Acute vaginitis: Secondary | ICD-10-CM

## 2023-01-15 DIAGNOSIS — N926 Irregular menstruation, unspecified: Secondary | ICD-10-CM

## 2023-01-15 LAB — POCT WET PREP (WET MOUNT)
Clue Cells Wet Prep Whiff POC: POSITIVE
Trichomonas Wet Prep HPF POC: ABSENT

## 2023-01-15 LAB — POCT URINE PREGNANCY: Preg Test, Ur: NEGATIVE

## 2023-01-15 MED ORDER — NICOTINE POLACRILEX 2 MG MT GUM: 2.0000 mg | CHEWING_GUM | OROMUCOSAL | 0 refills | Status: DC | PRN

## 2023-01-15 MED ORDER — METRONIDAZOLE 500 MG PO TABS: 500.0000 mg | ORAL_TABLET | Freq: Two times a day (BID) | ORAL | 0 refills | Status: DC

## 2023-01-15 NOTE — Patient Instructions (Addendum)
It was wonderful to see you today.  Please bring ALL of your medications with you to every visit.   Today we talked about:  I will message you with results  Continue your prenatal vitamin  I recommend stopping vaping this can cause harm to a developing baby   Tobacco use is damaging to your body. It increases your risk of stroke, heart attack, lung cancer, and serious lung disease in the future. It also reduces your fertility.   Quitting tobacco is the best thing for your health but is a challenge---nicotine, a chemical in cigarettes, is highly addictive.   You can call 1 800 QUIT NOW [phone])420-7652)---you will be connected with a Careers information officer. They can also mail you nicotine gums, lozenges, and patches to quit.   Ask me about patches (which you wear all day) and gums (which you use when you have a craving) to help you quit.   There are safe, effective medications to help you quit--  Varencline---also called Chantix---- is the most common medication used to help people stop smoking. It starts a low dose and is increased. I recommended choosing a quit date then starting the medication 8 days before this. Side effects include mild headache, difficulty sleeping, and odd dreams. The medication is typically very well tolerated.     Bupropion---also called Zyban---- is started 1 week before your quit date. You take 1 pill for three days then increase to 1 pill twice per day. Side effects include a mild headache and anxiety---this usually goes away. Some patients experience weight loss.     Please follow up in 3 months   Thank you for choosing [facility].   Please call [phone] with any questions about today's appointment.  Please be sure to schedule follow up at the front  desk before you leave today.   Terisa Starr, MD  Family Medicine

## 2023-01-15 NOTE — Progress Notes (Signed)
    SUBJECTIVE:   CHIEF COMPLAINT: vaginal discharge  HPI:   Carla Bean is a 22 y.o.  with history notable for vaping use presenting for discharge.   The patient reports vaginal discharge.  She uses condoms at times for contraception.  Last menstrual period last month--she is late this month but desires pregnacy .  Current partners two--uses condoms with 1.  Last Pap 2023.  Prior infections include none.     PERTINENT  PMH / PSH/Family/Social History : Seizures   OBJECTIVE:   BP 103/68   Pulse 96   Ht 5\' 2"  (1.575 m)   Wt 116 lb 9.6 oz (52.9 kg)   SpO2 100%   BMI 21.33 kg/m   Today's weight:  Last Weight  Most recent update: 01/15/2023 11:15 AM    Weight  52.9 kg (116 lb 9.6 oz)            Review of prior weights: Filed Weights   01/15/23 1114  Weight: 116 lb 9.6 oz (52.9 kg)    GU Exam:    External exam: Normal-appearing female external genitalia.  Vaginal exam notable for normal appearance.  Cervix without discharge or obvious lesion.  Bimanual exam reveals normal sized uterus no masses appreciated, no pain with examination.  Chaperoned examine, CMA [place].    ASSESSMENT/PLAN:   Assessment & Plan Vaginal discharge + BV, sent Rx  Nicotine use disorder Discussed vaping cessation Rx nicotine gum  Missed menses Continue prenatal vitamins  Negative pregnancy test  Bacterial vaginitis Bacterial vaginosis    Terisa Starr, MD  Family Medicine Teaching Service  [facility]

## 2023-01-16 LAB — HIV ANTIBODY (ROUTINE TESTING W REFLEX): HIV Screen 4th Generation wRfx: NONREACTIVE

## 2023-01-16 LAB — HEPATITIS B SURFACE ANTIGEN: Hepatitis B Surface Ag: NEGATIVE

## 2023-01-16 LAB — HCV INTERPRETATION

## 2023-01-16 LAB — HCV AB W REFLEX TO QUANT PCR: HCV Ab: NONREACTIVE

## 2023-01-16 LAB — RPR: RPR Ser Ql: NONREACTIVE

## 2023-01-18 LAB — CERVICOVAGINAL ANCILLARY ONLY
Bacterial Vaginitis (gardnerella): POSITIVE — AB
Candida Glabrata: NEGATIVE
Candida Vaginitis: NEGATIVE
Chlamydia: NEGATIVE
Comment: NEGATIVE
Comment: NEGATIVE
Comment: NEGATIVE
Comment: NEGATIVE
Comment: NEGATIVE
Comment: NORMAL
Neisseria Gonorrhea: NEGATIVE
Trichomonas: NEGATIVE

## 2023-01-31 ENCOUNTER — Emergency Department (HOSPITAL_COMMUNITY)
Admission: EM | Admit: 2023-01-31 | Discharge: 2023-01-31 | Discharge status: Against Medical Advice | Payer: Self-pay | Attending: Emergency Medicine | Admitting: Emergency Medicine

## 2023-01-31 ENCOUNTER — Other Ambulatory Visit: Payer: Self-pay

## 2023-01-31 ENCOUNTER — Encounter (HOSPITAL_COMMUNITY): Payer: Self-pay

## 2023-01-31 DIAGNOSIS — L02414 Cutaneous abscess of left upper limb: Secondary | ICD-10-CM | POA: Insufficient documentation

## 2023-01-31 DIAGNOSIS — Z5321 Procedure and treatment not carried out due to patient leaving prior to being seen by health care provider: Secondary | ICD-10-CM | POA: Insufficient documentation

## 2023-01-31 NOTE — ED Triage Notes (Addendum)
Patient reports abscess under left arm. Denies fevers.  Also requesting HIV test. Thinks her BF is cheating on her.

## 2023-02-01 ENCOUNTER — Encounter: Payer: Self-pay | Admitting: Family Medicine

## 2023-02-01 ENCOUNTER — Other Ambulatory Visit: Payer: Self-pay

## 2023-02-01 ENCOUNTER — Emergency Department (HOSPITAL_COMMUNITY)
Admission: EM | Admit: 2023-02-01 | Discharge: 2023-02-01 | Disposition: A | Discharge status: Home / Self Care | Payer: Self-pay | Attending: Emergency Medicine | Admitting: Emergency Medicine

## 2023-02-01 DIAGNOSIS — L02412 Cutaneous abscess of left axilla: Secondary | ICD-10-CM | POA: Insufficient documentation

## 2023-02-01 LAB — HIV ANTIBODY (ROUTINE TESTING W REFLEX): HIV Screen 4th Generation wRfx: NONREACTIVE

## 2023-02-01 MED ORDER — IBUPROFEN 600 MG PO TABS: 600.0000 mg | ORAL_TABLET | Freq: Four times a day (QID) | ORAL | 0 refills | Status: DC | PRN

## 2023-02-01 MED ORDER — SULFAMETHOXAZOLE-TRIMETHOPRIM 800-160 MG PO TABS: 1.0000 | ORAL_TABLET | Freq: Two times a day (BID) | ORAL | 0 refills | Status: DC

## 2023-02-01 MED ORDER — LIDOCAINE-EPINEPHRINE (PF) 2 %-1:200000 IJ SOLN
10.0000 mL | Freq: Once | INTRAMUSCULAR | Status: DC
  Filled 2023-02-01: qty 20

## 2023-02-01 NOTE — ED Triage Notes (Signed)
Patient to ED by POV with c/o abscess to left armpit. States it has been there 3-4 days. Denies N/V/D. No drainage from site and no s/s infection.

## 2023-02-01 NOTE — Discharge Instructions (Signed)
You are seen in the emergency department today with concerns of an abscess.  Incision and drainage was performed on this with some drainage present.  I suspect there is mild infection underneath the skin so a prescription for Bactrim DS was sent to her pharmacy.  Please take this as prescribed twice daily for the next 5 days.  If any new or worsening symptoms arise, return the emergency department.

## 2023-02-01 NOTE — ED Provider Notes (Signed)
 [place] [facility] DE[facility] AT [facility] Provider Note   CSN: [id number] Arrival date & time: 02/01/23  4098     History Chief Complaint  Patient presents with   Abscess    Left armpit    AARIKA MOON is a 22 y.o. female.  Patient without significant past medical history presents the emergency department concerns of an abscess.  Reports that she began noticing an abscess to the left armpit about 3 to 4 days ago.  Endorsing notable pain from this area but denies any fever, chills, body aches, skin irritation, drainage, from the site..  She had an abscess drained in this area several months ago. No history of hidradenitis suppurativa.   Abscess      Home Medications Prior to Admission medications   Medication Sig Start Date End Date Taking? Authorizing Provider  ibuprofen (ADVIL) 600 MG tablet Take 1 tablet (600 mg total) by mouth every 6 (six) hours as needed. 02/01/23  Yes Maryanna Shape A, PA-C  sulfamethoxazole-trimethoprim (BACTRIM DS) 800-160 MG tablet Take 1 tablet by mouth 2 (two) times daily for 5 days. 02/01/23 02/06/23 Yes Smitty Knudsen, PA-C  metroNIDAZOLE (FLAGYL) 500 MG tablet Take 1 tablet (500 mg total) by mouth 2 (two) times daily. 01/15/23   Westley Chandler, MD  nicotine polacrilex (NICORETTE) 2 MG gum Take 1 each (2 mg total) by mouth as needed for smoking cessation. 01/15/23   Westley Chandler, MD  nitrofurantoin, macrocrystal-monohydrate, (MACROBID) 100 MG capsule Take 1 capsule (100 mg total) by mouth 2 (two) times daily. 04/25/20   Joy, Shawn C, PA-C  Prenatal Vit-Fe Fumarate-FA (PRENATAL VITAMINS) 28-0.8 MG TABS Take 1 tablet by mouth daily. 10/02/22   Celine Mans, MD      Allergies    Pineapple    Review of Systems   Review of Systems  Skin:  Positive for wound.  All other systems reviewed and are negative.   Physical Exam Updated Vital Signs BP 104/70 (BP Location: Right Arm)   Pulse 89   Temp 98.5 F (36.9 C) (Oral)    Ht 5\' 3"  (1.6 m)   Wt 53.1 kg   LMP 01/18/2023   SpO2 99%   BMI 20.73 kg/m  Physical Exam Vitals and nursing note reviewed.  Constitutional:      General: She is not in acute distress.    Appearance: Normal appearance. She is not ill-appearing.  HENT:     Head: Normocephalic and atraumatic.  Eyes:     General: No scleral icterus.       Right eye: No discharge.        Left eye: No discharge.  Cardiovascular:     Rate and Rhythm: Normal rate and regular rhythm.  Skin:    Findings: Lesion present. No rash.          Comments: Left axilla with area of fluctuance measuring about 2-3cm. No notable skin induration or redness.  Neurological:     Mental Status: She is alert.     ED Results / Procedures / Treatments   Labs (all labs ordered are listed, but only abnormal results are displayed) Labs Reviewed - No data to display  EKG None  Radiology No results found.  Procedures .Ultrasound ED Soft Tissue  Date/Time: 02/01/2023 10:44 AM  Performed by: Smitty Knudsen, PA-C Authorized by: Smitty Knudsen, PA-C   Procedure details:    Indications: localization of abscess     Transverse view:  Not visualized  Longitudinal view:  Visualized   Images: archived     Limitations:  Positioning Location:    Location: upper extremity     Side:  Left Findings:     abscess present    no cellulitis present    no foreign body present .Incision and Drainage  Date/Time: 02/02/2023 11:18 PM  Performed by: Smitty Knudsen, PA-C Authorized by: Smitty Knudsen, PA-C   Consent:    Consent obtained:  Verbal   Consent given by:  Patient   Risks discussed:  Bleeding and incomplete drainage   Alternatives discussed:  Delayed treatment and no treatment Universal protocol:    Patient identity confirmed:  Verbally with patient Location:    Type:  Abscess   Size:  3cm   Location:  Upper extremity   Upper extremity location: Left axilla. Pre-procedure details:    Skin preparation:   Chlorhexidine Sedation:    Sedation type:  None Anesthesia:    Anesthesia method:  Local infiltration   Local anesthetic:  Lidocaine 2% WITH epi Procedure type:    Complexity:  Simple Procedure details:    Ultrasound guidance: no     Incision types:  Stab incision   Incision depth:  Dermal   Wound management:  Probed and deloculated, irrigated with saline, extensive cleaning and debrided   Drainage:  Serosanguinous   Drainage amount:  Scant   Wound treatment:  Wound left open Post-procedure details:    Procedure completion:  Tolerated     Medications Ordered in ED Medications - No data to display  ED Course/ Medical Decision Making/ A&P                                 Medical Decision Making Risk Prescription drug management.   This patient presents to the ED for concern of abscess. Differential diagnosis includes skin abscess, cellulitis, hidradenitis suppurativa   Imaging Studies ordered:  I ordered imaging studies including bedside US of soft tissue  I independently visualized and interpreted imaging which showed abscess visualized in the left axilla I agree with the radiologist interpretation   Medicines ordered and prescription drug management:  I ordered medication including lidocaine-epinephrine  for anesthesia  Reevaluation of the patient after these medicines showed that the patient improved I have reviewed the patients home medicines and have made adjustments as needed   Problem List / ED Course:  Patient without significant past medical history presents to the emergency department concerns of an abscess.  Reports this been ongoing for the last 3 to 4 days in her left axilla.  Previously had an abscess to this area was drained in the emergency department.  Not sure hidradenitis suppurativa.  Denies any redness, skin swelling, drainage, or any systemic symptoms. Bedside ultrasound confirms presence of abscess in the left axilla.  Will proceed with  incision and drainage. I&D performed. Scant serosanguinous drainage achieved. After drainage, notable induration of skin underneath. Concern for possible cellulitis. Will start patient on short course of Bactrim to reduce risk of complications. Concern that patient may have HS as she has had multiple abscess in bilateral axilla. Advised possible dermatology follow up if recurrence. Encouraged patient to take ibuprofen or Tylenol as needed for pain control. No other acute or focal concerns at this time. Discharged home in stable condition.  Final Clinical Impression(s) / ED Diagnoses Final diagnoses:  Abscess of left axilla    Rx / DC Orders ED  Discharge Orders          Ordered    sulfamethoxazole-trimethoprim (BACTRIM DS) 800-160 MG tablet  2 times daily        02/01/23 1148    ibuprofen (ADVIL) 600 MG tablet  Every 6 hours PRN        02/01/23 1148              Smitty Knudsen, PA-C 02/02/23 2322    Alvira Monday, MD 02/03/23 [id number]

## 2023-02-04 ENCOUNTER — Encounter (HOSPITAL_COMMUNITY): Payer: Self-pay | Admitting: Emergency Medicine

## 2023-02-04 ENCOUNTER — Other Ambulatory Visit: Payer: Self-pay | Admitting: Family Medicine

## 2023-02-04 ENCOUNTER — Ambulatory Visit (HOSPITAL_COMMUNITY)
Admission: EM | Admit: 2023-02-04 | Discharge: 2023-02-04 | Disposition: A | Discharge status: Home / Self Care | Payer: Self-pay | Attending: Emergency Medicine | Admitting: Emergency Medicine

## 2023-02-04 ENCOUNTER — Other Ambulatory Visit: Payer: Self-pay

## 2023-02-04 DIAGNOSIS — L02412 Cutaneous abscess of left axilla: Secondary | ICD-10-CM

## 2023-02-04 DIAGNOSIS — F17200 Nicotine dependence, unspecified, uncomplicated: Secondary | ICD-10-CM

## 2023-02-04 MED ORDER — ACETAMINOPHEN 325 MG PO TABS
ORAL_TABLET | ORAL | Status: AC
  Filled 2023-02-04: qty 3

## 2023-02-04 MED ORDER — NICOTINE POLACRILEX 2 MG MT GUM: 2.0000 mg | CHEWING_GUM | OROMUCOSAL | 0 refills | Status: DC

## 2023-02-04 MED ORDER — DOXYCYCLINE HYCLATE 100 MG PO CAPS: 100.0000 mg | ORAL_CAPSULE | Freq: Two times a day (BID) | ORAL | 0 refills | Status: AC

## 2023-02-04 MED ORDER — ACETAMINOPHEN 325 MG PO TABS
975.0000 mg | ORAL_TABLET | Freq: Once | ORAL | Status: AC
  Administered 2023-02-04: 975 mg via ORAL

## 2023-02-04 NOTE — Discharge Instructions (Signed)
 Stop taking bactrim Instead start the doxycycline Twice daily for 7 days Take with food to avoid upset stomach.  Alternate ibuprofen and tylenol Continue warm compress  Please call dermatologist and/or general surgeon for follow up

## 2023-02-04 NOTE — ED Triage Notes (Signed)
 Pt c/o left side armpit for over a week now not getting better. Was seen on ED for same.

## 2023-02-04 NOTE — ED Provider Notes (Signed)
 [facility]    CSN: [id number] Arrival date & time: 02/04/23  9063      History   Chief Complaint Chief Complaint  Patient presents with   Abscess    HPI Carla Bean is a 23 y.o. female.  About a week of abscess in the left axilla 3 days ago went to ED, abscess was drained. Started on bactrim , today is day 3. Reports does not feel better. Rating pain 9/10, bigger today. Took ibu this morning  Denies fever or chills  Past Medical History:  Diagnosis Date   Seizures [facility])     Patient Active Problem List   Diagnosis Date Noted   Encounter for fertility planning 12/06/2020   Healthcare maintenance 12/06/2020   Screen for STD (sexually transmitted disease) 05/27/2018   Substance abuse (HCC) 03/25/2012   Contraception management 11/13/2011    Past Surgical History:  Procedure Laterality Date   EYE SURGERY      OB History   No obstetric history on file.      Home Medications    Prior to Admission medications   Medication Sig Start Date End Date Taking? Authorizing Provider  doxycycline  (VIBRAMYCIN ) 100 MG capsule Take 1 capsule (100 mg total) by mouth 2 (two) times daily for 7 days. 02/04/23 02/11/23 Yes Rossy Virag, [place], PA-C  ibuprofen  (ADVIL ) 600 MG tablet Take 1 tablet (600 mg total) by mouth every 6 (six) hours as needed. 02/01/23   Zelaya, Oscar A, PA-C  metroNIDAZOLE  (FLAGYL ) 500 MG tablet Take 1 tablet (500 mg total) by mouth 2 (two) times daily. 01/15/23   Delores Suzann CHRISTELLA, MD  nicotine  polacrilex (NICORETTE ) 2 MG gum Take 1 each (2 mg total) by mouth as needed for smoking cessation. 01/15/23   Delores Suzann CHRISTELLA, MD  Prenatal Vit-Fe Fumarate-FA (PRENATAL VITAMINS) 28-0.8 MG TABS Take 1 tablet by mouth daily. 10/02/22   Alba Sharper, MD    Family History Family History  Problem Relation Age of Onset   Cancer Mother     Social History Social History   Tobacco Use   Smoking status: Every Day    Types: Cigars, E-cigarettes   Smokeless  tobacco: Never   Tobacco comments:    mom smokes around her  Vaping Use   Vaping status: Never Used  Substance Use Topics   Alcohol use: Never   Drug use: Never     Allergies   Pineapple   Review of Systems Review of Systems Per HPI  Physical Exam Triage Vital Signs ED Triage Vitals [02/04/23 1032]  Encounter Vitals Group     BP 111/71     Systolic BP Percentile      Diastolic BP Percentile      Pulse Rate 74     Resp 18     Temp 98.2 F (36.8 C)     Temp Source Oral     SpO2 100 %     Weight      Height      Head Circumference      Peak Flow      Pain Score 9     Pain Loc      Pain Education      Exclude from Growth Chart    No data found.  Updated Vital Signs BP 111/71 (BP Location: Right Arm)   Pulse 74   Temp 98.2 F (36.8 C) (Oral)   Resp 18   LMP 01/18/2023   SpO2 100%   Visual Acuity Right Eye  Distance:   Left Eye Distance:   Bilateral Distance:    Right Eye Near:   Left Eye Near:    Bilateral Near:     Physical Exam Vitals and nursing note reviewed.  Constitutional:      General: She is not in acute distress. HENT:     Mouth/Throat:     Mouth: Mucous membranes are moist.     Pharynx: Oropharynx is clear.  Cardiovascular:     Rate and Rhythm: Normal rate and regular rhythm.     Heart sounds: Normal heart sounds.  Pulmonary:     Effort: Pulmonary effort is normal.     Breath sounds: Normal breath sounds.  Musculoskeletal:     Cervical back: Normal range of motion.  Skin:    General: Skin is warm and dry.     Findings: Abscess present.     Comments: Left axilla with firm indurated and tender abscess. Golf-ball sized. Some erythema. No drainage  Neurological:     Mental Status: She is alert and oriented to person, place, and time.      UC Treatments / Results  Labs (all labs ordered are listed, but only abnormal results are displayed) Labs Reviewed - No data to display  EKG   Radiology No results  found.  Procedures Procedures (including critical care time)  Medications Ordered in UC Medications  acetaminophen  (TYLENOL ) tablet 975 mg (975 mg Oral Given 02/04/23 1127)    Initial Impression / Assessment and Plan / UC Course  I have reviewed the triage vital signs and the nursing notes.  Pertinent labs & imaging results that were available during my care of the patient were reviewed by me and considered in my medical decision making (see chart for details).   Afebrile Switch to doxycycline  BID x 7 Firm indurated abscess. Cannot drain in clinic today. Advised contact gen surg and dermatology for follow up. Symptomatic care in the meantime. Tylenol  dose given in clinic; has taken ibu this morning. Return and ED precautions.   Final Clinical Impressions(s) / UC Diagnoses   Final diagnoses:  Abscess of left axilla     Discharge Instructions      Stop taking bactrim  Instead start the doxycycline  Twice daily for 7 days Take with food to avoid upset stomach.  Alternate ibuprofen  and tylenol  Continue warm compress  Please call dermatologist and/or general surgeon for follow up     ED Prescriptions     Medication Sig Dispense Auth. Provider   doxycycline  (VIBRAMYCIN ) 100 MG capsule Take 1 capsule (100 mg total) by mouth 2 (two) times daily for 7 days. 14 capsule Michelle Vanhise, [place], PA-C      I have reviewed the PDMP during this encounter.   Nikyla Navedo, Asberry RIGGERS 02/04/23 1203

## 2023-02-09 ENCOUNTER — Ambulatory Visit: Payer: Self-pay | Admitting: Family Medicine

## 2023-02-09 NOTE — Progress Notes (Deleted)
    SUBJECTIVE:   CHIEF COMPLAINT / HPI:   ***  PERTINENT  PMH / PSH: Reviewed.  OBJECTIVE:   LMP 01/18/2023   ***  ASSESSMENT/PLAN:   Assessment & Plan  No follow-ups on file.  Celine Mans, MD [facility]

## 2023-05-28 ENCOUNTER — Ambulatory Visit: Payer: Self-pay | Admitting: Student

## 2023-05-28 VITALS — BP 103/68 | HR 89 | Wt 119.8 lb

## 2023-05-28 DIAGNOSIS — N898 Other specified noninflammatory disorders of vagina: Secondary | ICD-10-CM | POA: Insufficient documentation

## 2023-05-28 LAB — POCT URINE PREGNANCY: Preg Test, Ur: NEGATIVE

## 2023-05-28 LAB — POCT WET PREP (WET MOUNT)
Clue Cells Wet Prep Whiff POC: POSITIVE
Trichomonas Wet Prep HPF POC: ABSENT
WBC, Wet Prep HPF POC: >20

## 2023-05-28 NOTE — Progress Notes (Signed)
  SUBJECTIVE:   CHIEF COMPLAINT / HPI:   She is experiencing symptoms consistent with bacterial vaginosis, which she attributes to frequent sexual intercourse with her boyfriend as she is trying to conceive. She believes that the sperm affects her vaginal pH balance, leading to her symptoms. No irritation, itching, burning, fever, or abdominal pain.  Her last menstrual cycle began on April 1st, and she notes that her periods are irregular, making it difficult to predict when her next cycle will occur.  PERTINENT  PMH / PSH:   OBJECTIVE:  BP 103/68   Pulse 89   Wt 119 lb 12.8 oz (54.3 kg)   LMP 05/04/2023   SpO2 100%   BMI 21.22 kg/m  Physical Exam Exam conducted with a chaperone present.  Constitutional:      General: She is not in acute distress.    Appearance: Normal appearance. She is not ill-appearing.  Genitourinary:    Vagina: No signs of injury. Vaginal discharge present. No erythema, tenderness, bleeding or lesions.     Cervix: Discharge and friability present. No cervical motion tenderness, lesion, erythema, cervical bleeding or eversion.  Neurological:     Mental Status: She is alert.      ASSESSMENT/PLAN:   Assessment & Plan Vaginal odor Patient comes in with concern for vaginal odor, and discharge.  Patient appreciates she has had symptoms for the last few days, gets these symptoms when she is sexually active with her boyfriend.  Patient does not use birth control as she is trying to conceive.  Patient appreciates that boyfriend's semen has affected her pH before and caused her to have BV.  She is willing and open to receive STI testing, but declines HIV and syphilis. -Cervical vaginal testing for BV, trichomoniasis, yeast, gonorrhea, chlamydia - Urine pregnancy test No follow-ups on file. Wilhemena Harbour, MD 05/28/2023, 9:13 AM PGY-3, [facility]

## 2023-05-28 NOTE — Assessment & Plan Note (Addendum)
 Patient comes in with concern for vaginal odor, and discharge.  Patient appreciates she has had symptoms for the last few days, gets these symptoms when she is sexually active with her boyfriend.  Patient does not use birth control as she is trying to conceive.  Patient appreciates that boyfriend's semen has affected her pH before and caused her to have BV.  She is willing and open to receive STI testing, but declines HIV and syphilis. -Cervical vaginal testing for BV, trichomoniasis, yeast, gonorrhea, chlamydia - Urine pregnancy test

## 2023-05-28 NOTE — Patient Instructions (Signed)
 It was great to see you! Thank you for allowing me to participate in your care!  I recommend that you always bring your medications to each appointment as this makes it easy to ensure we are on the correct medications and helps us  not miss when refills are needed.  Our plans for today:  - Screening for Sexually Transmitted Infections  Testing for: Gonorrhea, Chlamydia, BV, Trichomonas, and yeast  We are checking some labs today, I will call you if they are abnormal will send you a MyChart message or a letter if they are normal.  If you do not hear about your labs in the next 2 weeks please let us  know.  Take care and seek immediate care sooner if you develop any concerns.   Dr. Wilhemena Harbour, MD [facility]

## 2023-05-29 ENCOUNTER — Other Ambulatory Visit: Payer: Self-pay | Admitting: Family Medicine

## 2023-05-29 DIAGNOSIS — N760 Acute vaginitis: Secondary | ICD-10-CM

## 2023-06-01 ENCOUNTER — Encounter: Payer: Self-pay | Admitting: Student

## 2023-06-01 MED ORDER — METRONIDAZOLE 500 MG PO TABS: 500.0000 mg | ORAL_TABLET | Freq: Two times a day (BID) | ORAL | 0 refills | Status: DC

## 2023-06-26 ENCOUNTER — Inpatient Hospital Stay (HOSPITAL_COMMUNITY): Payer: MEDICAID

## 2023-06-26 ENCOUNTER — Encounter (HOSPITAL_COMMUNITY): Payer: Self-pay

## 2023-06-26 ENCOUNTER — Inpatient Hospital Stay (HOSPITAL_COMMUNITY)
Admission: AD | Admit: 2023-06-26 | Discharge: 2023-06-27 | Discharge status: Against Medical Advice | Payer: MEDICAID | Attending: Obstetrics and Gynecology | Admitting: Obstetrics and Gynecology

## 2023-06-26 DIAGNOSIS — O3481 Maternal care for other abnormalities of pelvic organs, first trimester: Secondary | ICD-10-CM | POA: Insufficient documentation

## 2023-06-26 DIAGNOSIS — N8311 Corpus luteum cyst of right ovary: Secondary | ICD-10-CM | POA: Insufficient documentation

## 2023-06-26 DIAGNOSIS — Z5321 Procedure and treatment not carried out due to patient leaving prior to being seen by health care provider: Secondary | ICD-10-CM | POA: Insufficient documentation

## 2023-06-26 DIAGNOSIS — Z3A01 Less than 8 weeks gestation of pregnancy: Secondary | ICD-10-CM | POA: Insufficient documentation

## 2023-06-26 LAB — ABO/RH
ABO/RH(D): B NEG
Antibody Screen: NEGATIVE

## 2023-06-26 LAB — URINALYSIS, ROUTINE W REFLEX MICROSCOPIC
Bilirubin Urine: NEGATIVE
Glucose, UA: NEGATIVE mg/dL
Hgb urine dipstick: NEGATIVE
Ketones, ur: NEGATIVE mg/dL
Leukocytes,Ua: NEGATIVE
Nitrite: NEGATIVE
Protein, ur: NEGATIVE mg/dL
Specific Gravity, Urine: 1.028 (ref 1.005–1.030)
pH: 6 (ref 5.0–8.0)

## 2023-06-26 LAB — CBC
HCT: 36.1 % (ref 36.0–46.0)
Hemoglobin: 11.7 g/dL — ABNORMAL LOW (ref 12.0–15.0)
MCH: 29.8 pg (ref 26.0–34.0)
MCHC: 32.4 g/dL (ref 30.0–36.0)
MCV: 92.1 fL (ref 80.0–100.0)
Platelets: 205 10*3/uL (ref 150–400)
RBC: 3.92 MIL/uL (ref 3.87–5.11)
RDW: 12.4 % (ref 11.5–15.5)
WBC: 6 10*3/uL (ref 4.0–10.5)
nRBC: 0 % (ref 0.0–0.2)

## 2023-06-26 LAB — POCT PREGNANCY, URINE: Preg Test, Ur: POSITIVE — AB

## 2023-06-26 LAB — HCG, QUANTITATIVE, PREGNANCY: hCG, Beta Chain, Quant, S: 1424 m[IU]/mL — ABNORMAL HIGH (ref <5)

## 2023-06-26 LAB — WET PREP, GENITAL
Sperm: NONE SEEN
Trich, Wet Prep: NONE SEEN
WBC, Wet Prep HPF POC: <10 (ref <10)
Yeast Wet Prep HPF POC: NONE SEEN

## 2023-06-26 NOTE — MAU Note (Signed)
 Second attempt to call patient. Patient not in lobby.

## 2023-06-26 NOTE — MAU Note (Signed)
 MAU Triage Note:  .Carla Bean is a 23 y.o. at Unknown here in MAU reporting: positive HPT yesterday with complaints of bilateral hip aching and back pain for the past week. Pain is not associated with movement. Denies VB or abnormal discharge.   Patient complaint: right side pain  Pain Score: 7  Pain Location: Hip Pain Score: 6 Pain Location: Back   Onset of complaint: Last week LMP: Patient's last menstrual period was 05/28/2023.  Vitals:   06/26/23 1924  BP: 105/65  Pulse: 95  Resp: 16  Temp: 98.3 F (36.8 C)  SpO2: 100%    Lab orders placed from triage: UPT

## 2023-06-27 NOTE — MAU Note (Signed)
 Third attempt to call patient. Patient not in lobby and presumed to have left AMA.

## 2023-06-28 ENCOUNTER — Inpatient Hospital Stay (HOSPITAL_COMMUNITY)
Admission: AD | Admit: 2023-06-28 | Discharge: 2023-06-28 | Disposition: A | Discharge status: Home / Self Care | Payer: Self-pay | Attending: Family Medicine | Admitting: Family Medicine

## 2023-06-28 DIAGNOSIS — R109 Unspecified abdominal pain: Secondary | ICD-10-CM

## 2023-06-28 DIAGNOSIS — Z3A01 Less than 8 weeks gestation of pregnancy: Secondary | ICD-10-CM

## 2023-06-28 DIAGNOSIS — B9689 Other specified bacterial agents as the cause of diseases classified elsewhere: Secondary | ICD-10-CM | POA: Insufficient documentation

## 2023-06-28 DIAGNOSIS — O26891 Other specified pregnancy related conditions, first trimester: Secondary | ICD-10-CM

## 2023-06-28 DIAGNOSIS — O23591 Infection of other part of genital tract in pregnancy, first trimester: Secondary | ICD-10-CM | POA: Insufficient documentation

## 2023-06-28 LAB — HCG, QUANTITATIVE, PREGNANCY: hCG, Beta Chain, Quant, S: 2754 m[IU]/mL — ABNORMAL HIGH (ref <5)

## 2023-06-28 MED ORDER — METRONIDAZOLE 500 MG PO TABS: 500.0000 mg | ORAL_TABLET | Freq: Two times a day (BID) | ORAL | 0 refills | Status: DC

## 2023-06-28 NOTE — MAU Note (Signed)
..  Carla Bean is a 23 y.o. at [redacted]w[redacted]d here in MAU reporting: was here the other day but left AMA due to wait time. She states that she returned because she has continued to have lower abdominal cramping and tightness that comes and goes. Denies Vb. Does have a milky white vaginal discharge.   Pain score: 7 Vitals:   06/28/23 1629  BP: 102/63  Pulse: (!) 103  Resp: 14  Temp: 98.1 F (36.7 C)  SpO2: 100%    Lab orders placed from triage:   UA

## 2023-06-28 NOTE — MAU Provider Note (Signed)
 Chief Complaint: Abdominal Pain   None     SUBJECTIVE HPI: Carla Bean is a 23 y.o. G1P0 at [redacted]w[redacted]d by LMP who presents to maternity admissions reporting fullness/tightness in her lower abdomen at night, especially after eating.  US  on 06/25/23 showed probably early gestational sac only.   She denies vaginal bleeding.  HPI  Past Medical History:  Diagnosis Date   Seizures (HCC)    Past Surgical History:  Procedure Laterality Date   EYE SURGERY     Social History   Socioeconomic History   Marital status: Single    Spouse name: Not on file   Number of children: Not on file   Years of education: Not on file   Highest education level: Not on file  Occupational History   Not on file  Tobacco Use   Smoking status: Every Day    Types: Cigars, E-cigarettes   Smokeless tobacco: Never   Tobacco comments:    mom smokes around her  Vaping Use   Vaping status: Never Used  Substance and Sexual Activity   Alcohol use: Never   Drug use: Never   Sexual activity: Yes  Other Topics Concern   Not on file  Social History Narrative   Not on file   Social Drivers of Health   Financial Resource Strain: Not on file  Food Insecurity: Not on file  Transportation Needs: Not on file  Physical Activity: Not on file  Stress: Not on file  Social Connections: Not on file  Intimate Partner Violence: Not on file   No current facility-administered medications on file prior to encounter.   Current Outpatient Medications on File Prior to Encounter  Medication Sig Dispense Refill   ibuprofen  (ADVIL ) 600 MG tablet Take 1 tablet (600 mg total) by mouth every 6 (six) hours as needed. 30 tablet 0   metroNIDAZOLE  (FLAGYL ) 500 MG tablet Take 1 tablet (500 mg total) by mouth 2 (two) times daily. 14 tablet 0   nicotine  polacrilex (NICORETTE ) 2 MG gum Take 1 each (2 mg total) by mouth every 4 (four) hours while awake. 100 tablet 0   Prenatal Vit-Fe Fumarate-FA (PRENATAL VITAMINS) 28-0.8 MG TABS  Take 1 tablet by mouth daily. 30 tablet 12   Allergies  Allergen Reactions   Pineapple Other (See Comments)    unknown    ROS:  Review of Systems   I have reviewed patient's Past Medical Hx, Surgical Hx, Family Hx, Social Hx, medications and allergies.   Physical Exam  Patient Vitals for the past 24 hrs:  BP Temp Temp src Pulse Resp SpO2  06/28/23 1629 102/63 98.1 F (36.7 C) Oral (!) 103 14 100 %   Constitutional: Well-developed, well-nourished female in no acute distress.  Cardiovascular: normal rate Respiratory: normal effort GI: Abd soft, non-tender. Pos BS x 4 MS: Extremities nontender, no edema, normal ROM Neurologic: Alert and oriented x 4.  GU: Neg CVAT.  PELVIC EXAM: Deferred    LAB RESULTS No results found for this or any previous visit (from the past 24 hours).  --/--/B NEG (05/24 2053)  IMAGING US  OB LESS THAN 14 WEEKS WITH OB TRANSVAGINAL Result Date: 06/26/2023 CLINICAL DATA:  Abdominal pain, left hip pain, pregnant EXAM: OBSTETRIC <14 WK US  AND TRANSVAGINAL OB US  TECHNIQUE: Both transabdominal and transvaginal ultrasound examinations were performed for complete evaluation of the gestation as well as the maternal uterus, adnexal regions, and pelvic cul-de-sac. Transvaginal technique was performed to assess early pregnancy. COMPARISON:  None Available.  FINDINGS: Intrauterine gestational sac: Single Yolk sac:  Not Visualized. Embryo:  Not Visualized. Cardiac Activity: Not Visualized. MSD: 2.4 mm   4 w   6 d Subchorionic hemorrhage:  None visualized. Maternal uterus/adnexae: Right ovary measures 3.8 x 2.5 x 2.5 cm and the left ovary measures 2.7 x 1.0 x 1.8 cm. Corpus luteal cyst is seen within the right ovary measuring approximately 2.3 cm. Trace pelvic free fluid. IMPRESSION: 1. Probable early intrauterine gestational sac, but no yolk sac, fetal pole, or cardiac activity yet visualized. Recommend follow-up quantitative B-HCG levels and follow-up US  in 14 days to  assess viability. This recommendation follows SRU consensus guidelines: Diagnostic Criteria for Nonviable Pregnancy Early in the First Trimester. Mel Spine Med 2013; 595:6387-56. 2. Corpus luteal cyst right ovary. 3. Trace pelvic free fluid. Electronically Signed   By: Bobbye Burrow M.D.   On: 06/26/2023 21:46    MAU Management/MDM: Orders Placed This Encounter  Procedures   hCG, quantitative, pregnancy    No orders of the defined types were placed in this encounter.   Consult ***.  Treatments in MAU included ***. Pt discharged with strict *** precautions.  ASSESSMENT No diagnosis found.  PLAN Discharge home Allergies as of 06/28/2023       Reactions   Pineapple Other (See Comments)   unknown     Med Rec must be completed prior to using this [facility]***        Arlester Bence Certified Nurse-Midwife 06/28/2023  5:50 PM

## 2023-06-29 LAB — GC/CHLAMYDIA PROBE AMP (~~LOC~~) NOT AT ARMC
Chlamydia: POSITIVE — AB
Comment: NEGATIVE
Comment: NORMAL
Neisseria Gonorrhea: NEGATIVE

## 2023-06-30 ENCOUNTER — Telehealth (HOSPITAL_COMMUNITY): Payer: Self-pay

## 2023-06-30 ENCOUNTER — Ambulatory Visit (HOSPITAL_COMMUNITY): Payer: Self-pay

## 2023-06-30 MED ORDER — AZITHROMYCIN 500 MG PO TABS: 1000.0000 mg | ORAL_TABLET | Freq: Once | ORAL | 0 refills | Status: AC

## 2023-07-06 ENCOUNTER — Ambulatory Visit (HOSPITAL_COMMUNITY)
Admission: RE | Admit: 2023-07-06 | Discharge: 2023-07-06 | Disposition: A | Discharge status: Home / Self Care | Payer: Self-pay | Source: Ambulatory Visit | Attending: Advanced Practice Midwife | Admitting: Advanced Practice Midwife

## 2023-07-06 DIAGNOSIS — O26891 Other specified pregnancy related conditions, first trimester: Secondary | ICD-10-CM | POA: Insufficient documentation

## 2023-07-06 DIAGNOSIS — R109 Unspecified abdominal pain: Secondary | ICD-10-CM | POA: Insufficient documentation

## 2023-07-06 MED ORDER — LIDOCAINE HCL 1 % IJ SOLN
INTRAMUSCULAR | Status: AC
Start: 2023-07-06 — End: ?
  Filled 2023-07-06: qty 20

## 2023-07-13 ENCOUNTER — Ambulatory Visit: Payer: Self-pay | Admitting: Advanced Practice Midwife

## 2023-08-17 NOTE — Progress Notes (Unsigned)
 History:   Carla Bean is a 23 y.o. G1P0 at [redacted]w[redacted]d by LMP being seen today for her first obstetrical visit.  Her obstetrical history is significant for primigravida. Patient does intend to breast feed. Pregnancy history fully reviewed.  Patient reports nausea.      HISTORY: OB History  Gravida Para Term Preterm AB Living  1 0 0 0 0 0  SAB IAB Ectopic Multiple Live Births  0 0 0 1 0    # Outcome Date GA Lbr Len/2nd Weight Sex Type Anes PTL Lv  1A Gravida           1B Current             Last pap smear was done 2023 and was normal  Past Medical History:  Diagnosis Date   Seizures (HCC)    Past Surgical History:  Procedure Laterality Date   EYE SURGERY     Family History  Problem Relation Age of Onset   Cancer Mother        breast   Hypertension Father    Social History   Tobacco Use   Smoking status: Former   Smokeless tobacco: Never   Tobacco comments:    mom smokes around her  Vaping Use   Vaping status: Never Used  Substance Use Topics   Alcohol use: Never   Drug use: Never   Allergies  Allergen Reactions   Pineapple Other (See Comments)    unknown   Current Outpatient Medications on File Prior to Visit  Medication Sig Dispense Refill   metroNIDAZOLE  (FLAGYL ) 500 MG tablet Take 1 tablet (500 mg total) by mouth 2 (two) times daily. (Patient not taking: Reported on 08/20/2023) 14 tablet 0   nicotine  polacrilex (NICORETTE ) 2 MG gum Take 1 each (2 mg total) by mouth every 4 (four) hours while awake. (Patient not taking: Reported on 08/20/2023) 100 tablet 0   No current facility-administered medications on file prior to visit.    Review of Systems Pertinent items noted in HPI and remainder of comprehensive ROS otherwise negative. Physical Exam:   Vitals:   08/20/23 0934  BP: 109/71  Pulse: 89  Weight: 120 lb (54.4 kg)   Fetal Heart Rate (bpm): 159  Constitutional: Well-developed, well-nourished pregnant female in no acute distress.  HEENT:  PERRLA Skin: normal color and turgor, no rash Cardiovascular: normal rate & rhythm, warm and well perfused Respiratory: normal effort, no problems with respiration noted GI: Abd soft, non-distended MS: Extremities nontender, no edema, normal ROM Neurologic: Alert and oriented x 4.  GU: no CVA tenderness Pelvic: NEFG, physiologic discharge, no blood, cervix clean Swabs collected  Assessment:    Pregnancy: G1P0 Patient Active Problem List   Diagnosis Date Noted   Encounter for supervision of normal first pregnancy 08/20/2023   Vaginal odor 05/28/2023   Encounter for fertility planning 12/06/2020   Healthcare maintenance 12/06/2020   Screen for STD (sexually transmitted disease) 05/27/2018   Substance abuse (HCC) 03/25/2012   Contraception management 11/13/2011     Plan:    1. Encounter for supervision of normal first pregnancy in first trimester (Primary) - CBC/D/Plt+RPR+Rh+ABO+RubIgG... - Culture, OB Urine - Cervicovaginal ancillary only - Hemoglobin A1c - US  MFM OB COMP + 14 WK; Future - PANORAMA PRENATAL TEST - [place] Basic Panel - Prenatal Vit-Fe Fumarate-FA (PRENATAL VITAMINS) 28-0.8 MG TABS; Take 1 tablet by mouth daily.  Dispense: 30 tablet; Refill: 12  2. [redacted] weeks gestation of pregnancy - Anticipatory guidance regarding  upcoming care. - Ordered prenatal  - ASA ordered.   3. Nausea and vomiting during pregnancy - ondansetron  (ZOFRAN -ODT) 4 MG disintegrating tablet; Take 1 tablet (4 mg total) by mouth every 8 (eight) hours as needed for nausea.  Dispense: 42 tablet; Refill: 2    - Initial labs drawn. - Continue prenatal vitamins. - Problem list reviewed and updated. - Genetic Screening discussed, First trimester screen, Quad screen, and NIPS: ordered. - Ultrasound discussed; fetal anatomic survey: requested. - Anticipatory guidance about prenatal visits given including labs, ultrasounds, and testing. - Discussed usage of Babyscripts and virtual visits as  additional source of managing and completing prenatal visits in midst of coronavirus and pandemic.   - Encouraged to complete MyChart Registration for her ability to review results, send requests, and have questions addressed.  - The nature of [place] [facility] with multiple MDs and Advanced Practice Providers was explained to patient; also emphasized that residents, students are part of our team. - Routine obstetric precautions reviewed. Encouraged to seek out care at office or emergency room [facility] MAU preferred) for urgent and/or emergent concerns.  Return in about 4 weeks (around 09/17/2023) for LOB.    No future appointments.   Camie Rote, MSN, CNM, RNC-OB Certified Nurse Midwife, [facility] 08/20/2023 10:19 AM

## 2023-08-20 ENCOUNTER — Encounter: Payer: Self-pay | Admitting: Certified Nurse Midwife

## 2023-08-20 ENCOUNTER — Other Ambulatory Visit (HOSPITAL_COMMUNITY)
Admission: RE | Admit: 2023-08-20 | Discharge: 2023-08-20 | Disposition: A | Discharge status: Home / Self Care | Payer: Self-pay | Source: Ambulatory Visit | Attending: Certified Nurse Midwife | Admitting: Certified Nurse Midwife

## 2023-08-20 ENCOUNTER — Ambulatory Visit: Payer: Self-pay | Admitting: Certified Nurse Midwife

## 2023-08-20 VITALS — BP 109/71 | HR 89 | Wt 120.0 lb

## 2023-08-20 DIAGNOSIS — Z3401 Encounter for supervision of normal first pregnancy, first trimester: Secondary | ICD-10-CM | POA: Insufficient documentation

## 2023-08-20 DIAGNOSIS — Z3A12 12 weeks gestation of pregnancy: Secondary | ICD-10-CM

## 2023-08-20 DIAGNOSIS — O219 Vomiting of pregnancy, unspecified: Secondary | ICD-10-CM

## 2023-08-20 DIAGNOSIS — Z3400 Encounter for supervision of normal first pregnancy, unspecified trimester: Secondary | ICD-10-CM | POA: Insufficient documentation

## 2023-08-20 MED ORDER — ASPIRIN 81 MG PO TBEC: 81.0000 mg | DELAYED_RELEASE_TABLET | Freq: Every day | ORAL | 3 refills | Status: AC

## 2023-08-20 MED ORDER — ONDANSETRON 4 MG PO TBDP: 4.0000 mg | ORAL_TABLET | Freq: Three times a day (TID) | ORAL | 2 refills | Status: AC | PRN

## 2023-08-20 MED ORDER — PRENATAL VITAMINS 28-0.8 MG PO TABS: 1.0000 | ORAL_TABLET | Freq: Every day | ORAL | 12 refills | Status: AC

## 2023-08-20 NOTE — Patient Instructions (Signed)
We highly recommend childbirth education to help you plan for labor and begin practicing coping skills (which will be needed with or without pain meds).  [place] Childbirth Education Options: Sign up by visiting [email or web address]  Childbirth ~ Self-Paced eClass (English and Spanish) This online class offers you the freedom to complete a childbirth education series in the comfort of your own home at your own pace.  Childbirth Class (In-Person 4-Week Series  or on Saturdays, Virtual 4-Week Series ~ [place]) This interactive in-person class series will help you and your partner prepare for your birth experience. Topics include: Labor & Birth, Comfort Measures, Breathing Techniques, Massage, Medical Interventions, Pain Management Options, Cesarean Birth, Postpartum Care, and Newborn Care  Comfort Techniques for Labor ~ In-Person Class ([place]) This interactive class is designed for parents-to-be who want to learn & practice hands-on skills to help relieve some of the discomfort of labor and encourage their babies to rotate toward the best position for birth. Moms and their partners will be able to try a variety of labor positions with birth balls and rebozos as well as practice breathing, relaxation, and visualization techniques.  Natural Childbirth Class (In-Person 5-Week Series, In-Person on Saturdays or Virtual 5-Week Series ~ [place]) This class series is designed for expectant parents who want to learn and practice natural methods of coping with the process of labor and childbirth.  Cesarean Birth Self-Paced eClass (English and Spanish) This online course provides comprehensive information you can trust as you prepare for a possible cesarean birth. In this class, you'll learn how to make your birth and recovery comfortable and joyful through instructive video clips, animations, and activities.  Waterbirth ~ Virtual Class Interested in a waterbirth? In addition to a consultation  with your credentialed waterbirth provider, this free, informational online class will help you discover whether waterbirth is the right fit for you. Not all obstetrical practices offer waterbirth, so check with your healthcare provider.  Tour (Self-Paced Video) - [facility] [place] Watch our 4 minute video tour of [facility] located in [place].   [place] Parenting Education Options:  Pregnancy 101 (Virtual) Congratulations on your pregnancy! This class is geared toward moms in their first trimester, but everyone is welcome. We are excited to guide you through all aspects of supporting a healthy pregnancy. You will learn what to expect at routine prenatal care appointments, common postpartum adjustments, basic infant safety, and breastfeeding.  Successful Partnering & Parenting ~ In-Person Workshop ([place]) This workshop inspires and equips partners of all economic levels, ages, and cultures to confidently care for their infants, support the birthing persons, and navigate their own transformations into new partners and parents. Learning activities are geared towards supporting partner, but moms are welcome to attend.  'Baby & Me' Parenting Group (Virtual on Wednesdays at 11am) Enjoy this time discussing newborn & infant parenting topics and family adjustment issues with other new parents in a relaxed environment. Each week brings a new speaker or baby-centered activity. This group offers support and connection to parents as they journey through the adjustments and struggles of that sometimes overwhelming first year after the birth of a child.  Baby Safety, CPR, & Choking Class ~ Virtual This life-saving information is meant to encourage parents as they learn important safety and prevention tips as well as infant CPR and relief of choking.  Breastfeeding Class (In-Person in [place] or Virtual) Families learn what to expect in the  first days and weeks of breastfeeding your   newborn.   Breastfeeding Self-Paced eClass (English & Spanish) Families learn what to expect in the first days and weeks of breastfeeding your newborn.  Caring for Baby ~ In-Person, Virtual or Self-Paced Class This in-person class is for both expectant and adoptive parents who want to learn and practice the most up-to-date newborn care for their babies. Focus is on birth through the first six weeks of life.  CPR & Choking Relief for Infants & Children ~ In-Person Class ([place]) This in-person course is designed for any parent, expectant parent, or adult who cares for infants or children. Participants learn and demonstrate cardiopulmonary resuscitation and choking relief procedures for both infants and children.  Grandparent Love ~ In-Person Class Grandparents will learn the most updated infant care and safety recommendations. They will discover ways to support their own children during the transition into the parenting role and receive tips on communicating with the new parents.  [place] Parenting Support Group Options:  Bereavement Grief Support Group (Pregnancy/Infant Loss) - Virtual This is an ongoing experience that meets once a month and is designed to help you honor the past, assist you in discovering tools to strengthen you today, and aid you in developing hope for the future.  Breastfeeding & Pumping Support Group (In-Person on Thursdays at 12pm or Virtual on Tuesdays at 5pm) Join us in-person each Thursday starting June 1st, 2023 at 12pm! This support group is free for all families looking for breastfeeding and/or pumping support.   Community-Based Childbirth Education Options:  [place] [facility] Classes:  Childbirth education classes can help you get ready for a positive parenting experience. You can also meet other expectant parents and get free stuff for your baby. Each class runs for five weeks on the same night  and costs $45 for the mother-to-be and her support person. Medicaid covers the cost if you are eligible. Call [phone] to register.  [facility] [place] [employer] offers a variety of programs for the [place] community and is another great way to get connected. Please go to [email or web address] for more information.  Childbirth With A Twist! Be informed of your options, get educated on birth, understand what your body is doing, learn how to cope, and have a lot of fun and laughs all while doing it either from the comfort of your couch OR in our cozy office and classroom space near the [place] airport. If you are taking a virtual class, then class is taught LIVE, so you can ask questions and receive answers in real-time from an experienced doula and childbirth educator.  This virtual childbirth education class will meet for five instruction times online.  Although we are based in [place], [place], this virtual class is open to anyone in the world. Please visit: [email or web address] for more information.  Books We Love: The Doula Guide to Childbirth by Ananda Lowe and Rachel Zimmerman The First-Time Parent's Childbirth Handbook by Dr. Stephanie Mitchell, CNM The Birth Partner by Penny Simkin  

## 2023-08-21 ENCOUNTER — Ambulatory Visit: Payer: Self-pay | Admitting: Certified Nurse Midwife

## 2023-08-21 LAB — CBC/D/PLT+RPR+RH+ABO+RUBIGG...
Antibody Screen: NEGATIVE
Basophils Absolute: 0 x10E3/uL (ref 0.0–0.2)
Basos: 0 %
EOS (ABSOLUTE): 0.2 x10E3/uL (ref 0.0–0.4)
Eos: 3 %
HCV Ab: NONREACTIVE
HIV Screen 4th Generation wRfx: NONREACTIVE
Hematocrit: 36.4 % (ref 34.0–46.6)
Hemoglobin: 11.7 g/dL (ref 11.1–15.9)
Hepatitis B Surface Ag: NEGATIVE
Immature Grans (Abs): 0 x10E3/uL (ref 0.0–0.1)
Immature Granulocytes: 0 %
Lymphocytes Absolute: 2.4 x10E3/uL (ref 0.7–3.1)
Lymphs: 35 %
MCH: 29.9 pg (ref 26.6–33.0)
MCHC: 32.1 g/dL (ref 31.5–35.7)
MCV: 93 fL (ref 79–97)
Monocytes Absolute: 0.4 x10E3/uL (ref 0.1–0.9)
Monocytes: 7 %
Neutrophils Absolute: 3.8 x10E3/uL (ref 1.4–7.0)
Neutrophils: 55 %
Platelets: 232 x10E3/uL (ref 150–450)
RBC: 3.91 x10E6/uL (ref 3.77–5.28)
RDW: 12.3 % (ref 11.7–15.4)
RPR Ser Ql: NONREACTIVE
Rh Factor: NEGATIVE
Rubella Antibodies, IGG: 2.47 {index} (ref >0.99)
WBC: 6.8 x10E3/uL (ref 3.4–10.8)

## 2023-08-21 LAB — HCV INTERPRETATION

## 2023-08-21 LAB — HEMOGLOBIN A1C
Est. average glucose Bld gHb Est-mCnc: 88 mg/dL
Hgb A1c MFr Bld: 4.7 % — ABNORMAL LOW (ref 4.8–5.6)

## 2023-08-22 LAB — URINE CULTURE, OB REFLEX: Organism ID, Bacteria: NO GROWTH

## 2023-08-22 LAB — CULTURE, OB URINE

## 2023-08-23 LAB — CERVICOVAGINAL ANCILLARY ONLY
Chlamydia: NEGATIVE
Comment: NEGATIVE
Comment: NORMAL
Neisseria Gonorrhea: NEGATIVE

## 2023-08-24 ENCOUNTER — Telehealth: Payer: Self-pay | Admitting: Family Medicine

## 2023-08-24 NOTE — Telephone Encounter (Signed)
 Pt needs scheduled for anatomy ultrasound. Her next appt at [facility] is on 09/16/2023 @9 :30. Phone number on file is correct

## 2023-08-30 ENCOUNTER — Telehealth: Payer: Self-pay | Admitting: Obstetrics and Gynecology

## 2023-08-31 ENCOUNTER — Telehealth: Payer: Self-pay | Admitting: Obstetrics and Gynecology

## 2023-08-31 LAB — PANORAMA PRENATAL TEST FULL PANEL:PANORAMA TEST PLUS 5 ADDITIONAL MICRODELETIONS: FETAL FRACTION: 10.7

## 2023-08-31 NOTE — Telephone Encounter (Signed)
 Called back today 08/31/2023  to offer appt no answer

## 2023-08-31 NOTE — Telephone Encounter (Signed)
 Called patient back to for appt

## 2023-09-02 LAB — HORIZON CUSTOM: REPORT SUMMARY: NEGATIVE

## 2023-09-15 NOTE — Progress Notes (Deleted)
   PRENATAL VISIT NOTE  Subjective:  Carla Bean is a 23 y.o. G1P0 at [redacted]w[redacted]d being seen today for ongoing prenatal care.  She is currently monitored for the following issues for this low-risk pregnancy and has Substance abuse (HCC); Healthcare maintenance; and Encounter for supervision of normal first pregnancy on their problem list.  Patient reports {[id number]}.   .  .   . Denies leaking of fluid.   The following portions of the patient's history were reviewed and updated as appropriate: allergies, current medications, past family history, past medical history, past social history, past surgical history and problem list.   Objective:    There were no vitals filed for this visit.  Fetal Status:           General: Alert, oriented and cooperative. Patient is in no acute distress.  Skin: Skin is warm and dry. No rash noted.   Cardiovascular: Normal heart rate noted  Respiratory: Normal respiratory effort, no problems with respiration noted  Abdomen: Soft, gravid, appropriate for gestational age.        Pelvic: Cervical exam deferred        Extremities: Normal range of motion.     Mental Status: Normal mood and affect. Normal behavior. Normal judgment and thought content.   Assessment and Plan:  Pregnancy: G1P0 at [redacted]w[redacted]d 1. Encounter for supervision of normal first pregnancy in second trimester (Primary) Recommended afp today. ***  2. Substance abuse (HCC) ***  3. Pregnancy with 15 completed weeks gestation ***  Preterm labor symptoms and general obstetric precautions including but not limited to vaginal bleeding, contractions, leaking of fluid and fetal movement were reviewed in detail with the patient. Please refer to After Visit Summary for other counseling recommendations.   No follow-ups on file.  Future Appointments  Date Time Provider Department Center  09/16/2023  9:30 AM Cleatus Moccasin, MD [facility]  10/22/2023  2:00 PM [facility] PROVIDER 1 [facility]   10/22/2023  2:30 PM [facility] US2 [facility]    Moccasin Cleatus, MD

## 2023-09-16 ENCOUNTER — Encounter: Payer: Self-pay | Admitting: Obstetrics and Gynecology

## 2023-09-16 ENCOUNTER — Telehealth: Payer: Self-pay | Admitting: *Deleted

## 2023-09-16 DIAGNOSIS — Z3A15 15 weeks gestation of pregnancy: Secondary | ICD-10-CM

## 2023-09-16 DIAGNOSIS — F1910 Other psychoactive substance abuse, uncomplicated: Secondary | ICD-10-CM

## 2023-09-16 DIAGNOSIS — Z3402 Encounter for supervision of normal first pregnancy, second trimester: Secondary | ICD-10-CM

## 2023-09-16 NOTE — Telephone Encounter (Signed)
 Left patient a message to call and reschedule missed OB appointment, message cut off.

## 2023-09-17 ENCOUNTER — Telehealth: Payer: Self-pay | Admitting: *Deleted

## 2023-09-17 NOTE — Telephone Encounter (Signed)
 Returned call from 09/16/2023 at 11:22 AM. Left patient a message to call and reschedule missed OB appointment.

## 2023-09-30 ENCOUNTER — Encounter: Payer: Self-pay | Admitting: Obstetrics and Gynecology

## 2023-10-20 ENCOUNTER — Telehealth: Payer: Self-pay | Admitting: *Deleted

## 2023-10-20 NOTE — Telephone Encounter (Signed)
 Returned call from 12:34 PM, office closed for lunch. Left patient a message that the office will call her back again after lunch.

## 2023-10-22 ENCOUNTER — Ambulatory Visit: Payer: MEDICAID | Attending: Certified Nurse Midwife

## 2023-10-22 ENCOUNTER — Ambulatory Visit (HOSPITAL_BASED_OUTPATIENT_CLINIC_OR_DEPARTMENT_OTHER): Payer: MEDICAID | Admitting: Maternal & Fetal Medicine

## 2023-10-22 VITALS — BP 103/60 | HR 101

## 2023-10-22 DIAGNOSIS — Z3401 Encounter for supervision of normal first pregnancy, first trimester: Secondary | ICD-10-CM | POA: Diagnosis not present

## 2023-10-22 DIAGNOSIS — Z3402 Encounter for supervision of normal first pregnancy, second trimester: Secondary | ICD-10-CM | POA: Diagnosis not present

## 2023-10-22 DIAGNOSIS — Z3A21 21 weeks gestation of pregnancy: Secondary | ICD-10-CM

## 2023-10-22 DIAGNOSIS — Z363 Encounter for antenatal screening for malformations: Secondary | ICD-10-CM | POA: Insufficient documentation

## 2023-10-22 NOTE — Progress Notes (Signed)
   Patient information  Patient Name: Carla Bean  Patient MRN:   [id number]  Referring practice: MFM Referring Provider: [place] [facility] SHIPPER  Problem List   Patient Active Problem List   Diagnosis Date Noted   Encounter for supervision of normal first pregnancy 08/20/2023   Healthcare maintenance 12/06/2020    Maternal Fetal medicine Consult  Carla Bean is a 23 y.o. G1P0 at [redacted]w[redacted]d here for ultrasound and consultation. [name] is doing well today with no acute concerns.  She had low risk aneuploidy screening of a female.  She has no medical problems.  She reports this pregnancy is going well.  We discussed the common indications for future ultrasounds.  At this time she has no indications warranting further ultrasounds.     Sonographic findings Single intrauterine pregnancy at 21w 0d. Fetal cardiac activity:  Observed and appears normal. Presentation: Cephalic. The anatomic structures that were well seen appear normal without evidence of soft markers. The anatomic survey is complete.  Fetal biometry shows the estimated fetal weight at the 33 percentile. Amniotic fluid: Within normal limits.  MVP: 5.06 cm. Placenta: Anterior. Adnexa: No abnormality visualized. Cervical length: 3.2 cm.  There are limitations of prenatal ultrasound such as the inability to detect certain abnormalities due to poor visualization. Various factors such as fetal position, gestational age and maternal body habitus may increase the difficulty in visualizing the fetal anatomy.    Recommendations -EDD should be 03/03/2024 based on  LMP  (05/28/23). -No further ultrasounds are recommended at this time based on the current indications. If future indications arise (e.g. size/date discrepancy on fundal height, gestational diabetes or hypertension) and an ultrasound is to be desired at our MFM office, please send a referral.   Review of Systems: A review of systems was performed  and was negative except per HPI   Vitals and Physical Exam    10/22/2023    1:35 PM 08/20/2023    9:34 AM 06/28/2023    4:29 PM  Vitals with BMI  Weight  120 lbs   Systolic 103 109 897  Diastolic 60 71 63  Pulse 101 89 103    Sitting comfortably on the sonogram table Nonlabored breathing Normal rate and rhythm Abdomen is nontender  Past pregnancies OB History  Gravida Para Term Preterm AB Living  1       SAB IAB Ectopic Multiple Live Births     1     # Outcome Date GA Lbr Len/2nd Weight Sex Type Anes PTL Lv  1A Gravida           1B Current              I spent 15 minutes reviewing the patients chart, including labs and images as well as counseling the patient about her medical conditions. Greater than 50% of the time was spent in direct face-to-face patient counseling.  Delora Smaller  MFM, [facility]   10/22/2023  3:22 PM

## 2023-10-25 NOTE — Progress Notes (Unsigned)
   PRENATAL VISIT NOTE  Subjective:  Carla Bean is a 23 y.o. G1P0 at 102w3d being seen today for ongoing prenatal care.  She is currently monitored for the following issues for this {Blank single:19197::high-risk,low-risk} pregnancy and has Healthcare maintenance and Encounter for supervision of normal first pregnancy on their problem list.  Patient reports {sx:14538}.   .  .   . Denies leaking of fluid.   The following portions of the patient's history were reviewed and updated as appropriate: allergies, current medications, past family history, past medical history, past social history, past surgical history and problem list.   Objective:    There were no vitals filed for this visit.  Fetal Status:           General: Alert, oriented and cooperative. Patient is in no acute distress.  Skin: Skin is warm and dry. No rash noted.   Cardiovascular: Normal heart rate noted  Respiratory: Normal respiratory effort, no problems with respiration noted  Abdomen: Soft, gravid, appropriate for gestational age.        Pelvic: {Blank single:19197::Cervical exam performed in the presence of a chaperone,Cervical exam deferred}        Extremities: Normal range of motion.     Mental Status: Normal mood and affect. Normal behavior. Normal judgment and thought content.   Assessment and Plan:  Pregnancy: G1P0 at [redacted]w[redacted]d 1. Encounter for supervision of normal first pregnancy in second trimester (Primary) ***  2. [redacted] weeks gestation of pregnancy ***  {Blank single:19197::Term,Preterm} labor symptoms and general obstetric precautions including but not limited to vaginal bleeding, contractions, leaking of fluid and fetal movement were reviewed in detail with the patient. Please refer to After Visit Summary for other counseling recommendations.   Return in about 4 weeks (around 11/26/2023) for LOB.  Future Appointments  Date Time Provider Department Center  10/29/2023 11:10 AM  [place], Camie LABOR, CNM [facility]    Camie LABOR Rote, CNM

## 2023-10-29 ENCOUNTER — Telehealth: Admitting: Certified Nurse Midwife

## 2023-10-29 ENCOUNTER — Telehealth: Payer: Self-pay

## 2023-10-29 DIAGNOSIS — Z3402 Encounter for supervision of normal first pregnancy, second trimester: Secondary | ICD-10-CM

## 2023-10-29 DIAGNOSIS — Z3A22 22 weeks gestation of pregnancy: Secondary | ICD-10-CM

## 2023-10-29 NOTE — Telephone Encounter (Signed)
 RN attempted to call patient to start virtual visit. RN left HIPAA compliant voicemail to call office, sent direct link to connect to mychart video visit.  Silvano LELON Piano, RN

## 2023-11-26 ENCOUNTER — Other Ambulatory Visit

## 2023-11-26 ENCOUNTER — Ambulatory Visit (INDEPENDENT_AMBULATORY_CARE_PROVIDER_SITE_OTHER): Admitting: Certified Nurse Midwife

## 2023-11-26 VITALS — BP 114/74 | HR 96 | Wt 147.0 lb

## 2023-11-26 DIAGNOSIS — Z3402 Encounter for supervision of normal first pregnancy, second trimester: Secondary | ICD-10-CM

## 2023-11-26 DIAGNOSIS — Z3A26 26 weeks gestation of pregnancy: Secondary | ICD-10-CM

## 2023-11-26 NOTE — Progress Notes (Signed)
   PRENATAL VISIT NOTE  Subjective:  Carla Bean is a 23 y.o. G1P0 at [redacted]w[redacted]d being seen today for ongoing prenatal care.  She is currently monitored for the following issues for this low-risk pregnancy and has Healthcare maintenance and Encounter for supervision of normal first pregnancy on their problem list.  Patient reports no complaints.  Contractions: Irritability. Vag. Bleeding: None.  Movement: Present. Denies leaking of fluid.   The following portions of the patient's history were reviewed and updated as appropriate: allergies, current medications, past family history, past medical history, past social history, past surgical history and problem list.   Objective:    Vitals:   11/26/23 0926  BP: 114/74  Pulse: 96  Weight: 147 lb (66.7 kg)    Fetal Status:  Fetal Heart Rate (bpm): 140 Fundal Height: 26 cm Movement: Present    General: Alert, oriented and cooperative. Patient is in no acute distress.  Skin: Skin is warm and dry. No rash noted.   Cardiovascular: Normal heart rate noted  Respiratory: Normal respiratory effort, no problems with respiration noted  Abdomen: Soft, gravid, appropriate for gestational age.  Pain/Pressure: Present     Pelvic: Cervical exam deferred        Extremities: Normal range of motion.  Edema: None  Mental Status: Normal mood and affect. Normal behavior. Normal judgment and thought content.   Assessment and Plan:  Pregnancy: G1P0 at [redacted]w[redacted]d 1. [redacted] weeks gestation of pregnancy - Doing well, feeling regular and vigorous fetal movement - Routine PNC, anticipatory guidance.  - Glucose Tolerance, 2 Hours w/1 Hour - CBC - RPR - HIV Antibody (routine testing w rflx) - Antibody screen  2. Encounter for supervision of normal first pregnancy in second trimester (Primary) - Glucose Tolerance, 2 Hours w/1 Hour - CBC - RPR - HIV Antibody (routine testing w rflx) - Antibody screen  Preterm labor symptoms and general obstetric precautions  including but not limited to vaginal bleeding, contractions, leaking of fluid and fetal movement were reviewed in detail with the patient. Please refer to After Visit Summary for other counseling recommendations.   Return in about 4 weeks (around 12/24/2023) for LOB.  Future Appointments  Date Time Provider Department Center  12/16/2023  1:50 PM Cleatus Moccasin, MD [facility]  12/29/2023  1:30 PM Erik Kieth BROCKS, MD [facility]    Camie DELENA Rote, CNM

## 2023-11-27 ENCOUNTER — Ambulatory Visit: Payer: Self-pay | Admitting: Certified Nurse Midwife

## 2023-11-27 DIAGNOSIS — O99012 Anemia complicating pregnancy, second trimester: Secondary | ICD-10-CM

## 2023-11-27 LAB — ANTIBODY SCREEN: Antibody Screen: NEGATIVE

## 2023-11-27 LAB — CBC
Hematocrit: 32.1 % — ABNORMAL LOW (ref 34.0–46.6)
Hemoglobin: 10.3 g/dL — ABNORMAL LOW (ref 11.1–15.9)
MCH: 30.3 pg (ref 26.6–33.0)
MCHC: 32.1 g/dL (ref 31.5–35.7)
MCV: 94 fL (ref 79–97)
Platelets: 302 x10E3/uL (ref 150–450)
RBC: 3.4 x10E6/uL — ABNORMAL LOW (ref 3.77–5.28)
RDW: 12.1 % (ref 11.7–15.4)
WBC: 8.3 x10E3/uL (ref 3.4–10.8)

## 2023-11-27 LAB — GLUCOSE TOLERANCE, 2 HOURS W/ 1HR
Glucose, 1 hour: 142 mg/dL (ref 70–179)
Glucose, 2 hour: 113 mg/dL (ref 70–152)
Glucose, Fasting: 73 mg/dL (ref 70–91)

## 2023-11-27 LAB — RPR: RPR Ser Ql: NONREACTIVE

## 2023-11-27 LAB — HIV ANTIBODY (ROUTINE TESTING W REFLEX): HIV Screen 4th Generation wRfx: NONREACTIVE

## 2023-11-27 MED ORDER — ACCRUFER 30 MG PO CAPS: 1.0000 | ORAL_CAPSULE | Freq: Every day | ORAL | 5 refills | Status: AC

## 2023-12-08 ENCOUNTER — Telehealth: Payer: Self-pay | Admitting: *Deleted

## 2023-12-08 NOTE — Telephone Encounter (Signed)
 Patient is having cold symptoms and wanted to know what she is able to take. Patient advised to follow safe medication list that she was given in her New OB packet. Patient agreed.

## 2023-12-16 ENCOUNTER — Encounter: Payer: Self-pay | Admitting: Obstetrics and Gynecology

## 2023-12-16 ENCOUNTER — Ambulatory Visit (INDEPENDENT_AMBULATORY_CARE_PROVIDER_SITE_OTHER): Admitting: Obstetrics and Gynecology

## 2023-12-16 VITALS — BP 102/63 | HR 105 | Wt 151.0 lb

## 2023-12-16 DIAGNOSIS — Z6791 Unspecified blood type, Rh negative: Secondary | ICD-10-CM | POA: Diagnosis not present

## 2023-12-16 DIAGNOSIS — Z3403 Encounter for supervision of normal first pregnancy, third trimester: Secondary | ICD-10-CM

## 2023-12-16 DIAGNOSIS — O26893 Other specified pregnancy related conditions, third trimester: Secondary | ICD-10-CM

## 2023-12-16 DIAGNOSIS — Z3A28 28 weeks gestation of pregnancy: Secondary | ICD-10-CM

## 2023-12-16 DIAGNOSIS — R350 Frequency of micturition: Secondary | ICD-10-CM

## 2023-12-16 DIAGNOSIS — O99013 Anemia complicating pregnancy, third trimester: Secondary | ICD-10-CM | POA: Diagnosis not present

## 2023-12-16 DIAGNOSIS — O99019 Anemia complicating pregnancy, unspecified trimester: Secondary | ICD-10-CM | POA: Insufficient documentation

## 2023-12-16 NOTE — Progress Notes (Addendum)
   PRENATAL VISIT NOTE  Subjective:  Carla Bean is a 23 y.o. G1P0 at [redacted]w[redacted]d being seen today for ongoing prenatal care.  She is currently monitored for the following issues for this low-risk pregnancy and has Healthcare maintenance; Encounter for supervision of normal first pregnancy; Anemia of pregnancy; and Rh negative state in antepartum period on their problem list.  Patient reports pain and urinary frequency.  Contractions: Not present. Vag. Bleeding: None.  Movement: Present. Denies leaking of fluid.   The following portions of the patient's history were reviewed and updated as appropriate: allergies, current medications, past family history, past medical history, past social history, past surgical history and problem list.   Objective:   Vitals:   12/16/23 1343  BP: 102/63  Pulse: (!) 105  Weight: 151 lb (68.5 kg)    Fetal Status:  Fetal Heart Rate (bpm): 144 Fundal Height: 29 cm Movement: Present    General: Alert, oriented and cooperative. Patient is in no acute distress.  Skin: Skin is warm and dry. No rash noted.   Cardiovascular: Normal heart rate noted  Respiratory: Normal respiratory effort, no problems with respiration noted  Abdomen: Soft, gravid, appropriate for gestational age.  Pain/Pressure: Absent     Pelvic: Cervical exam deferred        Extremities: Normal range of motion.  Edema: Trace  Mental Status: Normal mood and affect. Normal behavior. Normal judgment and thought content.   Assessment and Plan:  Pregnancy: G1P0 at [redacted]w[redacted]d 1. Encounter for supervision of normal first pregnancy in third trimester (Primary) 2 hr normal.  Urinary frequency and some pain with urination. Send for Ucx.   2. Pregnancy with 28 completed weeks gestation  3. Antepartum anemia Patient to start iron from supplement and diet. Reviewed dietary sources. Discussed impact of anemia on pregnancy, delivery and postpartum.   4. Rh negative state in antepartum period Rhogam  recommended. Pt accepts. We are out of Rhogam. Will have her come in for RN visit when in next week.     Preterm labor symptoms and general obstetric precautions including but not limited to vaginal bleeding, contractions, leaking of fluid and fetal movement were reviewed in detail with the patient. Please refer to After Visit Summary for other counseling recommendations.   No follow-ups on file.  Future Appointments  Date Time Provider Department Center  12/29/2023  1:30 PM Erik Kieth BROCKS, MD [facility]    Vina Solian, MD

## 2023-12-18 LAB — URINE CULTURE, OB REFLEX

## 2023-12-18 LAB — CULTURE, OB URINE

## 2023-12-28 ENCOUNTER — Telehealth: Payer: Self-pay | Admitting: *Deleted

## 2023-12-28 NOTE — Telephone Encounter (Signed)
 Patient advised of how to send medical records to another provider due to moving.

## 2023-12-29 ENCOUNTER — Ambulatory Visit (INDEPENDENT_AMBULATORY_CARE_PROVIDER_SITE_OTHER): Admitting: Obstetrics and Gynecology

## 2023-12-29 VITALS — BP 111/63 | HR 97 | Wt 156.0 lb

## 2023-12-29 DIAGNOSIS — Z6791 Unspecified blood type, Rh negative: Secondary | ICD-10-CM

## 2023-12-29 DIAGNOSIS — Z3A30 30 weeks gestation of pregnancy: Secondary | ICD-10-CM

## 2023-12-29 DIAGNOSIS — O99013 Anemia complicating pregnancy, third trimester: Secondary | ICD-10-CM

## 2023-12-29 DIAGNOSIS — O26899 Other specified pregnancy related conditions, unspecified trimester: Secondary | ICD-10-CM | POA: Diagnosis not present

## 2023-12-29 DIAGNOSIS — O26893 Other specified pregnancy related conditions, third trimester: Secondary | ICD-10-CM | POA: Diagnosis not present

## 2023-12-29 DIAGNOSIS — Z3403 Encounter for supervision of normal first pregnancy, third trimester: Secondary | ICD-10-CM

## 2023-12-29 MED ORDER — RHO D IMMUNE GLOBULIN 1500 UNIT/2ML IJ SOSY
300.0000 ug | PREFILLED_SYRINGE | Freq: Once | INTRAMUSCULAR | Status: AC
  Administered 2023-12-29: 300 ug via INTRAMUSCULAR

## 2023-12-29 NOTE — Progress Notes (Signed)
   PRENATAL VISIT NOTE  Subjective:  Carla Bean is a 23 y.o. G1P0 at [redacted]w[redacted]d being seen today for ongoing prenatal care.  She is currently monitored for the following issues for this low-risk pregnancy and has Encounter for supervision of normal first pregnancy; Anemia of pregnancy; and Rh negative state in antepartum period on their problem list.  Patient reports no complaints.  Contractions: Not present. Vag. Bleeding: None (reported went to hospital on 12/27/23 with bleeding after intercourse.).  Movement: Present. Denies leaking of fluid.   The following portions of the patient's history were reviewed and updated as appropriate: allergies, current medications, past family history, past medical history, past social history, past surgical history and problem list.   Objective:   Vitals:   12/29/23 1410  BP: 111/63  Pulse: 97  Weight: 156 lb (70.8 kg)    Fetal Status: Fetal Heart Rate (bpm): 155 Fundal Height: 29 cm Movement: Present     General:  Alert, oriented and cooperative. Patient is in no acute distress.  Skin: Skin is warm and dry. No rash noted.   Cardiovascular: Normal heart rate noted  Respiratory: Normal respiratory effort, no problems with respiration noted  Abdomen: Soft, gravid, appropriate for gestational age.  Pain/Pressure: Absent      Assessment and Plan:  Pregnancy: G1P0 at [redacted]w[redacted]d 1. Encounter for supervision of normal first pregnancy in third trimester 2. [redacted] weeks gestation of pregnancy (Primary) FH appropriate Has new OB appt scheduled 12/4, moved to [place]. Discussed she is welcome to come back here if she has any issues getting routine prenatal care  3. Rh negative state in antepartum period Rhogam today - rho (d) immune globulin  (RHIG/RHOPHYLAC ) injection 300 mcg  4. Anemia during pregnancy in third trimester PO iron - hasn't picked up/started taking yet  Please refer to After Visit Summary for other counseling recommendations.    Kieth JAYSON Carolin, MD
